# Patient Record
Sex: Male | Born: 1935 | Race: White | Hispanic: No | Marital: Married | Smoking: Former smoker
Health system: Southern US, Community
[De-identification: ages and names within clinical notes are randomized; demographics above are authoritative.]

## PROBLEM LIST (undated history)

## (undated) DIAGNOSIS — I359 Nonrheumatic aortic valve disorder, unspecified: Secondary | ICD-10-CM

## (undated) DIAGNOSIS — E119 Type 2 diabetes mellitus without complications: Secondary | ICD-10-CM

## (undated) DIAGNOSIS — D696 Thrombocytopenia, unspecified: Secondary | ICD-10-CM

## (undated) DIAGNOSIS — N189 Chronic kidney disease, unspecified: Secondary | ICD-10-CM

## (undated) DIAGNOSIS — K219 Gastro-esophageal reflux disease without esophagitis: Secondary | ICD-10-CM

## (undated) DIAGNOSIS — I10 Essential (primary) hypertension: Secondary | ICD-10-CM

## (undated) DIAGNOSIS — N401 Enlarged prostate with lower urinary tract symptoms: Secondary | ICD-10-CM

## (undated) DIAGNOSIS — E7800 Pure hypercholesterolemia, unspecified: Secondary | ICD-10-CM

## (undated) DIAGNOSIS — E785 Hyperlipidemia, unspecified: Secondary | ICD-10-CM

## (undated) DIAGNOSIS — K7689 Other specified diseases of liver: Secondary | ICD-10-CM

## (undated) DIAGNOSIS — M109 Gout, unspecified: Secondary | ICD-10-CM

## (undated) DIAGNOSIS — K7581 Nonalcoholic steatohepatitis (NASH): Secondary | ICD-10-CM

## (undated) DIAGNOSIS — I2510 Atherosclerotic heart disease of native coronary artery without angina pectoris: Secondary | ICD-10-CM

## (undated) HISTORY — DX: Essential (primary) hypertension: I10

## (undated) HISTORY — DX: Nonalcoholic steatohepatitis (NASH): K75.81

## (undated) HISTORY — PX: AORTIC VALVE REPLACEMENT: SHX41

## (undated) HISTORY — DX: Gout, unspecified: M10.9

## (undated) HISTORY — DX: Gastro-esophageal reflux disease without esophagitis: K21.9

## (undated) HISTORY — DX: Other specified diseases of liver: K76.89

## (undated) HISTORY — DX: Nonrheumatic aortic valve disorder, unspecified: I35.9

## (undated) HISTORY — PX: CORONARY ARTERY BYPASS GRAFT: SHX141

## (undated) HISTORY — PX: CHOLECYSTECTOMY: SHX55

## (undated) HISTORY — DX: Benign prostatic hyperplasia with lower urinary tract symptoms: N40.1

## (undated) HISTORY — DX: Atherosclerotic heart disease of native coronary artery without angina pectoris: I25.10

## (undated) HISTORY — DX: Chronic kidney disease, unspecified: N18.9

## (undated) HISTORY — DX: Thrombocytopenia, unspecified: D69.6

## (undated) HISTORY — DX: Pure hypercholesterolemia, unspecified: E78.00

## (undated) HISTORY — DX: Type 2 diabetes mellitus without complications: E11.9

## (undated) HISTORY — DX: Hyperlipidemia, unspecified: E78.5

---

## 2000-07-03 ENCOUNTER — Other Ambulatory Visit: Admission: RE | Admit: 2000-07-03 | Discharge: 2000-07-03 | Payer: Self-pay | Admitting: Urology

## 2000-07-03 ENCOUNTER — Encounter (INDEPENDENT_AMBULATORY_CARE_PROVIDER_SITE_OTHER): Payer: Self-pay | Admitting: Specialist

## 2000-08-03 ENCOUNTER — Ambulatory Visit (HOSPITAL_COMMUNITY): Admission: RE | Admit: 2000-08-03 | Discharge: 2000-08-03 | Payer: Self-pay | Admitting: Gastroenterology

## 2000-08-03 ENCOUNTER — Encounter (INDEPENDENT_AMBULATORY_CARE_PROVIDER_SITE_OTHER): Payer: Self-pay | Admitting: *Deleted

## 2000-08-15 ENCOUNTER — Encounter: Payer: Self-pay | Admitting: Gastroenterology

## 2000-08-15 ENCOUNTER — Encounter: Admission: RE | Admit: 2000-08-15 | Discharge: 2000-08-15 | Payer: Self-pay | Admitting: Gastroenterology

## 2001-07-11 ENCOUNTER — Ambulatory Visit (HOSPITAL_COMMUNITY): Admission: RE | Admit: 2001-07-11 | Discharge: 2001-07-11 | Payer: Self-pay | Admitting: Gastroenterology

## 2001-07-11 ENCOUNTER — Encounter (INDEPENDENT_AMBULATORY_CARE_PROVIDER_SITE_OTHER): Payer: Self-pay | Admitting: *Deleted

## 2003-10-31 ENCOUNTER — Encounter (INDEPENDENT_AMBULATORY_CARE_PROVIDER_SITE_OTHER): Payer: Self-pay | Admitting: Specialist

## 2003-10-31 ENCOUNTER — Ambulatory Visit (HOSPITAL_COMMUNITY): Admission: RE | Admit: 2003-10-31 | Discharge: 2003-10-31 | Payer: Self-pay | Admitting: Gastroenterology

## 2006-05-19 ENCOUNTER — Ambulatory Visit: Payer: Self-pay | Admitting: Endocrinology

## 2006-06-08 ENCOUNTER — Ambulatory Visit: Payer: Self-pay | Admitting: Endocrinology

## 2006-07-11 ENCOUNTER — Ambulatory Visit: Payer: Self-pay | Admitting: Endocrinology

## 2006-07-11 LAB — CONVERTED CEMR LAB
AST: 47 units/L — ABNORMAL HIGH (ref 0–37)
Albumin: 3.9 g/dL (ref 3.5–5.2)
Alkaline Phosphatase: 109 units/L (ref 39–117)
Total Bilirubin: 0.9 mg/dL (ref 0.3–1.2)
Triglycerides: 72 mg/dL (ref 0–149)

## 2006-07-24 ENCOUNTER — Ambulatory Visit: Payer: Self-pay | Admitting: Internal Medicine

## 2006-08-11 ENCOUNTER — Ambulatory Visit: Payer: Self-pay

## 2006-08-11 HISTORY — PX: TRANSTHORACIC ECHOCARDIOGRAM: SHX275

## 2006-08-11 HISTORY — PX: OTHER SURGICAL HISTORY: SHX169

## 2006-08-11 LAB — CONVERTED CEMR LAB
BUN: 21 mg/dL (ref 6–23)
CO2: 29 meq/L (ref 19–32)
GFR calc Af Amer: 59 mL/min
Glucose, Bld: 137 mg/dL — ABNORMAL HIGH (ref 70–99)
Potassium: 3.8 meq/L (ref 3.5–5.1)

## 2006-10-10 ENCOUNTER — Ambulatory Visit: Payer: Self-pay | Admitting: Endocrinology

## 2006-10-10 LAB — CONVERTED CEMR LAB
ALT: 30 units/L (ref 0–53)
AST: 40 units/L — ABNORMAL HIGH (ref 0–37)
Albumin: 4 g/dL (ref 3.5–5.2)
VLDL: 30 mg/dL (ref 0–40)

## 2006-11-02 ENCOUNTER — Encounter: Payer: Self-pay | Admitting: Endocrinology

## 2006-11-02 DIAGNOSIS — K219 Gastro-esophageal reflux disease without esophagitis: Secondary | ICD-10-CM

## 2006-11-02 DIAGNOSIS — E119 Type 2 diabetes mellitus without complications: Secondary | ICD-10-CM

## 2006-11-02 DIAGNOSIS — I2510 Atherosclerotic heart disease of native coronary artery without angina pectoris: Secondary | ICD-10-CM | POA: Insufficient documentation

## 2006-11-02 HISTORY — DX: Gastro-esophageal reflux disease without esophagitis: K21.9

## 2006-11-02 HISTORY — DX: Atherosclerotic heart disease of native coronary artery without angina pectoris: I25.10

## 2006-11-02 HISTORY — DX: Type 2 diabetes mellitus without complications: E11.9

## 2006-11-06 LAB — HM COLONOSCOPY: HM Colonoscopy: NORMAL

## 2007-02-07 ENCOUNTER — Ambulatory Visit: Payer: Self-pay | Admitting: Endocrinology

## 2007-02-09 ENCOUNTER — Ambulatory Visit: Payer: Self-pay | Admitting: Internal Medicine

## 2007-03-05 ENCOUNTER — Ambulatory Visit: Payer: Self-pay | Admitting: Internal Medicine

## 2007-06-07 ENCOUNTER — Ambulatory Visit: Payer: Self-pay | Admitting: Endocrinology

## 2007-06-14 ENCOUNTER — Telehealth (INDEPENDENT_AMBULATORY_CARE_PROVIDER_SITE_OTHER): Payer: Self-pay | Admitting: *Deleted

## 2007-06-14 ENCOUNTER — Ambulatory Visit: Payer: Self-pay | Admitting: Endocrinology

## 2007-06-14 DIAGNOSIS — M109 Gout, unspecified: Secondary | ICD-10-CM | POA: Insufficient documentation

## 2007-06-14 HISTORY — DX: Gout, unspecified: M10.9

## 2007-06-15 LAB — CONVERTED CEMR LAB: Uric Acid, Serum: 5.8 mg/dL (ref 2.4–7.0)

## 2007-08-17 ENCOUNTER — Ambulatory Visit: Payer: Self-pay | Admitting: Internal Medicine

## 2007-08-17 LAB — CONVERTED CEMR LAB
CO2: 30 meq/L (ref 19–32)
Chloride: 106 meq/L (ref 96–112)
Pro B Natriuretic peptide (BNP): 77 pg/mL (ref 0.0–100.0)
Sodium: 141 meq/L (ref 135–145)
Uric Acid, Serum: 5.8 mg/dL (ref 4.0–7.8)

## 2007-09-13 ENCOUNTER — Ambulatory Visit: Payer: Self-pay | Admitting: Internal Medicine

## 2007-09-13 LAB — CONVERTED CEMR LAB
Albumin: 4 g/dL (ref 3.5–5.2)
Alkaline Phosphatase: 136 units/L — ABNORMAL HIGH (ref 39–117)
Bilirubin, Direct: 0.2 mg/dL (ref 0.0–0.3)

## 2007-09-25 ENCOUNTER — Encounter: Payer: Self-pay | Admitting: Endocrinology

## 2007-10-08 ENCOUNTER — Ambulatory Visit: Payer: Self-pay | Admitting: Endocrinology

## 2007-10-08 DIAGNOSIS — E7800 Pure hypercholesterolemia, unspecified: Secondary | ICD-10-CM

## 2007-10-08 DIAGNOSIS — K7689 Other specified diseases of liver: Secondary | ICD-10-CM

## 2007-10-08 DIAGNOSIS — I10 Essential (primary) hypertension: Secondary | ICD-10-CM

## 2007-10-08 DIAGNOSIS — R609 Edema, unspecified: Secondary | ICD-10-CM

## 2007-10-08 HISTORY — DX: Other specified diseases of liver: K76.89

## 2007-10-08 HISTORY — DX: Pure hypercholesterolemia, unspecified: E78.00

## 2007-10-08 HISTORY — DX: Essential (primary) hypertension: I10

## 2007-11-12 ENCOUNTER — Ambulatory Visit: Payer: Self-pay | Admitting: Endocrinology

## 2007-11-14 LAB — CONVERTED CEMR LAB
AST: 47 units/L — ABNORMAL HIGH (ref 0–37)
Alkaline Phosphatase: 156 units/L — ABNORMAL HIGH (ref 39–117)
BUN: 15 mg/dL (ref 6–23)
Bilirubin, Direct: 0.3 mg/dL (ref 0.0–0.3)
Chloride: 109 meq/L (ref 96–112)
Eosinophils Absolute: 0.1 10*3/uL (ref 0.0–0.7)
Eosinophils Relative: 2.5 % (ref 0.0–5.0)
GFR calc non Af Amer: 49 mL/min
HDL: 29.4 mg/dL — ABNORMAL LOW (ref >39.0)
Hgb A1c MFr Bld: 5.6 % (ref 4.6–6.0)
MCV: 94 fL (ref 78.0–100.0)
Microalb Creat Ratio: 7.2 mg/g (ref 0.0–30.0)
Neutrophils Relative %: 67.5 % (ref 43.0–77.0)
PSA: 2.28 ng/mL (ref 0.10–4.00)
Platelets: 112 10*3/uL — ABNORMAL LOW (ref 150–400)
Potassium: 3.7 meq/L (ref 3.5–5.1)
RDW: 14.4 % (ref 11.5–14.6)
Sodium: 142 meq/L (ref 135–145)
TSH: 4.06 microintl units/mL (ref 0.35–5.50)
Total Bilirubin: 1 mg/dL (ref 0.3–1.2)
Total CHOL/HDL Ratio: 4.7
Uric Acid, Serum: 5.4 mg/dL (ref 4.0–7.8)
VLDL: 19 mg/dL (ref 0–40)
WBC: 4.6 10*3/uL (ref 4.5–10.5)

## 2007-11-19 ENCOUNTER — Telehealth: Payer: Self-pay | Admitting: Endocrinology

## 2007-11-20 ENCOUNTER — Ambulatory Visit: Payer: Self-pay | Admitting: Endocrinology

## 2007-11-20 DIAGNOSIS — R05 Cough: Secondary | ICD-10-CM

## 2007-11-28 ENCOUNTER — Telehealth (INDEPENDENT_AMBULATORY_CARE_PROVIDER_SITE_OTHER): Payer: Self-pay | Admitting: *Deleted

## 2007-12-24 ENCOUNTER — Ambulatory Visit: Payer: Self-pay | Admitting: Endocrinology

## 2008-02-22 ENCOUNTER — Telehealth: Payer: Self-pay | Admitting: Endocrinology

## 2008-02-22 ENCOUNTER — Ambulatory Visit: Payer: Self-pay | Admitting: Internal Medicine

## 2008-03-04 ENCOUNTER — Telehealth: Payer: Self-pay | Admitting: Endocrinology

## 2008-04-16 ENCOUNTER — Ambulatory Visit: Payer: Self-pay | Admitting: Endocrinology

## 2008-04-16 LAB — CONVERTED CEMR LAB: Hgb A1c MFr Bld: 7 % — ABNORMAL HIGH (ref 4.6–6.0)

## 2008-07-15 ENCOUNTER — Ambulatory Visit: Payer: Self-pay | Admitting: Endocrinology

## 2008-07-15 LAB — CONVERTED CEMR LAB: Hgb A1c MFr Bld: 6.5 % (ref 4.6–6.5)

## 2008-09-02 ENCOUNTER — Telehealth (INDEPENDENT_AMBULATORY_CARE_PROVIDER_SITE_OTHER): Payer: Self-pay | Admitting: *Deleted

## 2008-09-03 ENCOUNTER — Ambulatory Visit: Payer: Self-pay

## 2008-09-03 ENCOUNTER — Encounter: Payer: Self-pay | Admitting: Internal Medicine

## 2008-10-09 ENCOUNTER — Ambulatory Visit: Payer: Self-pay | Admitting: Internal Medicine

## 2008-10-15 ENCOUNTER — Encounter: Payer: Self-pay | Admitting: Endocrinology

## 2008-11-25 ENCOUNTER — Ambulatory Visit: Payer: Self-pay | Admitting: Endocrinology

## 2008-11-27 LAB — CONVERTED CEMR LAB
AST: 74 units/L — ABNORMAL HIGH (ref 0–37)
Albumin: 3.8 g/dL (ref 3.5–5.2)
BUN: 14 mg/dL (ref 6–23)
Basophils Absolute: 0 10*3/uL (ref 0.0–0.1)
CO2: 28 meq/L (ref 19–32)
Calcium: 9.4 mg/dL (ref 8.4–10.5)
Cholesterol: 121 mg/dL (ref 0–200)
Creatinine,U: 312.9 mg/dL
Eosinophils Absolute: 0.1 10*3/uL (ref 0.0–0.7)
GFR calc non Af Amer: 48.55 mL/min (ref >60)
Glucose, Bld: 164 mg/dL — ABNORMAL HIGH (ref 70–99)
HCT: 42.2 % (ref 39.0–52.0)
HDL: 30.4 mg/dL — ABNORMAL LOW (ref >39.00)
Hemoglobin, Urine: NEGATIVE
Hemoglobin: 14.3 g/dL (ref 13.0–17.0)
Hgb A1c MFr Bld: 6.7 % — ABNORMAL HIGH (ref 4.6–6.5)
Lymphs Abs: 1.4 10*3/uL (ref 0.7–4.0)
MCHC: 34 g/dL (ref 30.0–36.0)
Microalb, Ur: 14.1 mg/dL — ABNORMAL HIGH (ref 0.0–1.9)
Monocytes Relative: 9 % (ref 3.0–12.0)
Neutro Abs: 3.8 10*3/uL (ref 1.4–7.7)
PSA: 3.09 ng/mL (ref 0.10–4.00)
Platelets: 99 10*3/uL — ABNORMAL LOW (ref 150.0–400.0)
RDW: 15 % — ABNORMAL HIGH (ref 11.5–14.6)
TSH: 2.12 microintl units/mL (ref 0.35–5.50)
Total Bilirubin: 1.5 mg/dL — ABNORMAL HIGH (ref 0.3–1.2)
Total Protein, Urine: 30 mg/dL
Uric Acid, Serum: 4.8 mg/dL (ref 4.0–7.8)
Urine Glucose: NEGATIVE mg/dL
Urobilinogen, UA: 1 (ref 0.0–1.0)
VLDL: 13.6 mg/dL (ref 0.0–40.0)

## 2008-12-17 ENCOUNTER — Ambulatory Visit: Payer: Self-pay | Admitting: Endocrinology

## 2008-12-17 DIAGNOSIS — D696 Thrombocytopenia, unspecified: Secondary | ICD-10-CM

## 2008-12-17 DIAGNOSIS — R809 Proteinuria, unspecified: Secondary | ICD-10-CM | POA: Insufficient documentation

## 2008-12-17 HISTORY — DX: Thrombocytopenia, unspecified: D69.6

## 2008-12-18 LAB — CONVERTED CEMR LAB
HCV Ab: NEGATIVE
Hepatitis B Surface Ag: NEGATIVE

## 2008-12-23 ENCOUNTER — Encounter: Admission: RE | Admit: 2008-12-23 | Discharge: 2008-12-23 | Payer: Self-pay | Admitting: Endocrinology

## 2008-12-23 ENCOUNTER — Ambulatory Visit: Payer: Self-pay | Admitting: Internal Medicine

## 2008-12-23 ENCOUNTER — Ambulatory Visit: Payer: Self-pay | Admitting: Endocrinology

## 2008-12-23 ENCOUNTER — Telehealth: Payer: Self-pay | Admitting: Endocrinology

## 2008-12-24 ENCOUNTER — Encounter: Payer: Self-pay | Admitting: Endocrinology

## 2008-12-24 LAB — CBC WITH DIFFERENTIAL/PLATELET
EOS%: 2.5 % (ref 0.0–7.0)
LYMPH%: 25 % (ref 14.0–49.0)
MCH: 32.3 pg (ref 27.2–33.4)
MCV: 92.5 fL (ref 79.3–98.0)
MONO%: 9.5 % (ref 0.0–14.0)
Platelets: 90 10*3/uL — ABNORMAL LOW (ref 140–400)
RBC: 4.2 10*6/uL (ref 4.20–5.82)
RDW: 15.9 % — ABNORMAL HIGH (ref 11.0–14.6)

## 2008-12-24 LAB — COMPREHENSIVE METABOLIC PANEL
AST: 51 U/L — ABNORMAL HIGH (ref 0–37)
Albumin: 3.8 g/dL (ref 3.5–5.2)
Alkaline Phosphatase: 185 U/L — ABNORMAL HIGH (ref 39–117)
BUN: 16 mg/dL (ref 6–23)
Potassium: 3.8 mEq/L (ref 3.5–5.3)
Sodium: 138 mEq/L (ref 135–145)
Total Bilirubin: 1.4 mg/dL — ABNORMAL HIGH (ref 0.3–1.2)
Total Protein: 6.9 g/dL (ref 6.0–8.3)

## 2009-01-05 ENCOUNTER — Telehealth (INDEPENDENT_AMBULATORY_CARE_PROVIDER_SITE_OTHER): Payer: Self-pay | Admitting: *Deleted

## 2009-01-07 ENCOUNTER — Telehealth: Payer: Self-pay | Admitting: Endocrinology

## 2009-01-08 ENCOUNTER — Ambulatory Visit: Payer: Self-pay | Admitting: Endocrinology

## 2009-01-08 DIAGNOSIS — M542 Cervicalgia: Secondary | ICD-10-CM

## 2009-01-13 ENCOUNTER — Encounter: Payer: Self-pay | Admitting: Endocrinology

## 2009-01-16 ENCOUNTER — Ambulatory Visit (HOSPITAL_COMMUNITY): Admission: RE | Admit: 2009-01-16 | Discharge: 2009-01-16 | Payer: Self-pay | Admitting: General Surgery

## 2009-01-20 ENCOUNTER — Encounter: Payer: Self-pay | Admitting: Endocrinology

## 2009-01-22 ENCOUNTER — Ambulatory Visit (HOSPITAL_COMMUNITY): Admission: RE | Admit: 2009-01-22 | Discharge: 2009-01-22 | Payer: Self-pay | Admitting: General Surgery

## 2009-02-06 ENCOUNTER — Ambulatory Visit: Payer: Self-pay | Admitting: Endocrinology

## 2009-02-09 ENCOUNTER — Encounter: Payer: Self-pay | Admitting: Endocrinology

## 2009-02-09 ENCOUNTER — Encounter: Payer: Self-pay | Admitting: Internal Medicine

## 2009-02-11 ENCOUNTER — Encounter: Payer: Self-pay | Admitting: Endocrinology

## 2009-02-18 ENCOUNTER — Encounter: Payer: Self-pay | Admitting: Endocrinology

## 2009-02-21 ENCOUNTER — Encounter: Payer: Self-pay | Admitting: Endocrinology

## 2009-02-21 DIAGNOSIS — N138 Other obstructive and reflux uropathy: Secondary | ICD-10-CM

## 2009-02-21 DIAGNOSIS — N401 Enlarged prostate with lower urinary tract symptoms: Secondary | ICD-10-CM

## 2009-02-21 HISTORY — DX: Benign prostatic hyperplasia with lower urinary tract symptoms: N13.8

## 2009-02-21 HISTORY — DX: Benign prostatic hyperplasia with lower urinary tract symptoms: N40.1

## 2009-02-23 ENCOUNTER — Ambulatory Visit: Payer: Self-pay | Admitting: Endocrinology

## 2009-02-23 LAB — CONVERTED CEMR LAB: Hgb A1c MFr Bld: 6.8 % — ABNORMAL HIGH (ref 4.6–6.5)

## 2009-03-19 ENCOUNTER — Encounter: Payer: Self-pay | Admitting: Endocrinology

## 2009-03-25 ENCOUNTER — Encounter: Payer: Self-pay | Admitting: Internal Medicine

## 2009-03-25 ENCOUNTER — Encounter: Payer: Self-pay | Admitting: Endocrinology

## 2009-04-15 ENCOUNTER — Encounter: Payer: Self-pay | Admitting: Endocrinology

## 2009-05-05 ENCOUNTER — Encounter: Payer: Self-pay | Admitting: Internal Medicine

## 2009-05-05 ENCOUNTER — Encounter: Payer: Self-pay | Admitting: Endocrinology

## 2009-05-06 ENCOUNTER — Encounter: Payer: Self-pay | Admitting: Endocrinology

## 2009-05-13 ENCOUNTER — Telehealth (INDEPENDENT_AMBULATORY_CARE_PROVIDER_SITE_OTHER): Payer: Self-pay | Admitting: *Deleted

## 2009-05-15 ENCOUNTER — Encounter: Payer: Self-pay | Admitting: Endocrinology

## 2009-05-19 ENCOUNTER — Encounter: Payer: Self-pay | Admitting: Endocrinology

## 2009-05-22 ENCOUNTER — Encounter: Payer: Self-pay | Admitting: Endocrinology

## 2009-06-01 ENCOUNTER — Ambulatory Visit: Payer: Self-pay | Admitting: Endocrinology

## 2009-06-01 DIAGNOSIS — M25569 Pain in unspecified knee: Secondary | ICD-10-CM | POA: Insufficient documentation

## 2009-06-01 LAB — CONVERTED CEMR LAB
AST: 40 units/L — ABNORMAL HIGH (ref 0–37)
Albumin: 3.7 g/dL (ref 3.5–5.2)
Basophils Absolute: 0 10*3/uL (ref 0.0–0.1)
Eosinophils Relative: 2.7 % (ref 0.0–5.0)
HCT: 39.6 % (ref 39.0–52.0)
Hemoglobin: 13.1 g/dL (ref 13.0–17.0)
Hgb A1c MFr Bld: 7.8 % — ABNORMAL HIGH (ref 4.6–6.5)
Lymphocytes Relative: 23.2 % (ref 12.0–46.0)
Monocytes Relative: 5.9 % (ref 3.0–12.0)
Neutro Abs: 2.8 10*3/uL (ref 1.4–7.7)
RDW: 14.3 % (ref 11.5–14.6)
Sed Rate: 45 mm/hr — ABNORMAL HIGH (ref 0–22)
Total Bilirubin: 1.2 mg/dL (ref 0.3–1.2)
WBC: 4.1 10*3/uL — ABNORMAL LOW (ref 4.5–10.5)

## 2009-06-02 ENCOUNTER — Telehealth: Payer: Self-pay | Admitting: Endocrinology

## 2009-06-19 ENCOUNTER — Telehealth: Payer: Self-pay | Admitting: Endocrinology

## 2009-07-02 ENCOUNTER — Ambulatory Visit: Payer: Self-pay | Admitting: Internal Medicine

## 2009-07-02 DIAGNOSIS — E785 Hyperlipidemia, unspecified: Secondary | ICD-10-CM | POA: Insufficient documentation

## 2009-07-02 DIAGNOSIS — I359 Nonrheumatic aortic valve disorder, unspecified: Secondary | ICD-10-CM

## 2009-07-02 HISTORY — DX: Nonrheumatic aortic valve disorder, unspecified: I35.9

## 2009-07-02 HISTORY — DX: Hyperlipidemia, unspecified: E78.5

## 2009-07-06 ENCOUNTER — Telehealth (INDEPENDENT_AMBULATORY_CARE_PROVIDER_SITE_OTHER): Payer: Self-pay | Admitting: *Deleted

## 2009-07-24 ENCOUNTER — Ambulatory Visit: Payer: Self-pay | Admitting: Cardiology

## 2009-07-24 ENCOUNTER — Ambulatory Visit: Payer: Self-pay

## 2009-07-24 ENCOUNTER — Encounter: Payer: Self-pay | Admitting: Internal Medicine

## 2009-07-24 ENCOUNTER — Ambulatory Visit (HOSPITAL_COMMUNITY): Admission: RE | Admit: 2009-07-24 | Discharge: 2009-07-24 | Payer: Self-pay | Admitting: Internal Medicine

## 2009-08-03 ENCOUNTER — Telehealth: Payer: Self-pay | Admitting: Internal Medicine

## 2009-08-03 LAB — CONVERTED CEMR LAB
BUN: 12 mg/dL (ref 6–23)
Basophils Relative: 0.4 % (ref 0.0–3.0)
Calcium: 9.5 mg/dL (ref 8.4–10.5)
Cholesterol: 120 mg/dL (ref 0–200)
Creatinine, Ser: 1.1 mg/dL (ref 0.4–1.5)
Eosinophils Absolute: 0.1 10*3/uL (ref 0.0–0.7)
GFR calc non Af Amer: 69.33 mL/min (ref >60)
HDL: 44.8 mg/dL (ref >39.00)
Hemoglobin: 13.6 g/dL (ref 13.0–17.0)
LDL Cholesterol: 60 mg/dL (ref 0–99)
Lymphs Abs: 1.2 10*3/uL (ref 0.7–4.0)
MCHC: 34.8 g/dL (ref 30.0–36.0)
MCV: 91.5 fL (ref 78.0–100.0)
Monocytes Absolute: 0.4 10*3/uL (ref 0.1–1.0)
Neutro Abs: 3.9 10*3/uL (ref 1.4–7.7)
RBC: 4.26 M/uL (ref 4.22–5.81)
RDW: 16.1 % — ABNORMAL HIGH (ref 11.5–14.6)
Triglycerides: 78 mg/dL (ref 0.0–149.0)

## 2009-08-05 ENCOUNTER — Encounter: Payer: Self-pay | Admitting: Internal Medicine

## 2009-09-09 ENCOUNTER — Encounter: Payer: Self-pay | Admitting: Endocrinology

## 2009-09-09 ENCOUNTER — Telehealth: Payer: Self-pay | Admitting: Internal Medicine

## 2009-09-10 ENCOUNTER — Ambulatory Visit: Payer: Self-pay | Admitting: Endocrinology

## 2009-09-10 LAB — CONVERTED CEMR LAB: Hgb A1c MFr Bld: 7.3 % — ABNORMAL HIGH (ref 4.6–6.5)

## 2009-09-11 ENCOUNTER — Telehealth: Payer: Self-pay | Admitting: Internal Medicine

## 2009-09-14 ENCOUNTER — Telehealth (INDEPENDENT_AMBULATORY_CARE_PROVIDER_SITE_OTHER): Payer: Self-pay | Admitting: *Deleted

## 2009-10-14 ENCOUNTER — Encounter: Payer: Self-pay | Admitting: Endocrinology

## 2009-11-17 ENCOUNTER — Ambulatory Visit: Payer: Self-pay | Admitting: Internal Medicine

## 2009-11-17 DIAGNOSIS — I2510 Atherosclerotic heart disease of native coronary artery without angina pectoris: Secondary | ICD-10-CM

## 2009-11-17 HISTORY — DX: Atherosclerotic heart disease of native coronary artery without angina pectoris: I25.10

## 2009-11-20 ENCOUNTER — Telehealth: Payer: Self-pay | Admitting: Internal Medicine

## 2009-11-26 LAB — CONVERTED CEMR LAB
Basophils Absolute: 0 10*3/uL (ref 0.0–0.1)
Eosinophils Absolute: 0.1 10*3/uL (ref 0.0–0.7)
Hemoglobin: 12 g/dL — ABNORMAL LOW (ref 13.0–17.0)
Lymphocytes Relative: 26 % (ref 12.0–46.0)
MCHC: 34.5 g/dL (ref 30.0–36.0)
Neutro Abs: 2 10*3/uL (ref 1.4–7.7)
Neutrophils Relative %: 63.9 % (ref 43.0–77.0)
Platelets: 74 10*3/uL — ABNORMAL LOW (ref 150.0–400.0)
RDW: 16.1 % — ABNORMAL HIGH (ref 11.5–14.6)

## 2009-12-11 ENCOUNTER — Ambulatory Visit: Payer: Self-pay | Admitting: Endocrinology

## 2009-12-14 ENCOUNTER — Telehealth: Payer: Self-pay | Admitting: Internal Medicine

## 2009-12-15 LAB — CONVERTED CEMR LAB
Albumin: 4.4 g/dL (ref 3.5–5.2)
Basophils Absolute: 0 10*3/uL (ref 0.0–0.1)
CO2: 25 meq/L (ref 19–32)
Calcium: 9.7 mg/dL (ref 8.4–10.5)
Creatinine,U: 123.5 mg/dL
Eosinophils Absolute: 0.1 10*3/uL (ref 0.0–0.7)
Glucose, Bld: 139 mg/dL — ABNORMAL HIGH (ref 70–99)
HCT: 38.3 % — ABNORMAL LOW (ref 39.0–52.0)
HDL: 42.7 mg/dL (ref >39.00)
Hemoglobin, Urine: NEGATIVE
Hemoglobin: 13.1 g/dL (ref 13.0–17.0)
Hgb A1c MFr Bld: 6.6 % — ABNORMAL HIGH (ref 4.6–6.5)
Ketones, ur: NEGATIVE mg/dL
Lymphocytes Relative: 24.7 % (ref 12.0–46.0)
Lymphs Abs: 0.9 10*3/uL (ref 0.7–4.0)
MCHC: 34.2 g/dL (ref 30.0–36.0)
Microalb, Ur: 1.9 mg/dL (ref 0.0–1.9)
Monocytes Absolute: 0.3 10*3/uL (ref 0.1–1.0)
Neutro Abs: 2.5 10*3/uL (ref 1.4–7.7)
PSA: 0.94 ng/mL (ref 0.10–4.00)
RDW: 16.1 % — ABNORMAL HIGH (ref 11.5–14.6)
Sodium: 140 meq/L (ref 135–145)
TSH: 2.71 microintl units/mL (ref 0.35–5.50)
Uric Acid, Serum: 2.1 mg/dL — ABNORMAL LOW (ref 4.0–7.8)
Urine Glucose: NEGATIVE mg/dL
Urobilinogen, UA: 1 (ref 0.0–1.0)

## 2009-12-16 ENCOUNTER — Ambulatory Visit: Payer: Self-pay | Admitting: Hematology and Oncology

## 2009-12-25 ENCOUNTER — Ambulatory Visit: Payer: Self-pay | Admitting: Hematology and Oncology

## 2009-12-28 ENCOUNTER — Telehealth: Payer: Self-pay | Admitting: Endocrinology

## 2009-12-28 ENCOUNTER — Encounter: Payer: Self-pay | Admitting: Endocrinology

## 2009-12-28 LAB — CBC WITH DIFFERENTIAL/PLATELET
EOS%: 3.1 % (ref 0.0–7.0)
Eosinophils Absolute: 0.1 10*3/uL (ref 0.0–0.5)
LYMPH%: 25.6 % (ref 14.0–49.0)
MCH: 33.6 pg — ABNORMAL HIGH (ref 27.2–33.4)
MCV: 97.1 fL (ref 79.3–98.0)
MONO%: 6 % (ref 0.0–14.0)
NEUT#: 2.1 10*3/uL (ref 1.5–6.5)
Platelets: 71 10*3/uL — ABNORMAL LOW (ref 140–400)
RBC: 3.64 10*6/uL — ABNORMAL LOW (ref 4.20–5.82)
RDW: 15.7 % — ABNORMAL HIGH (ref 11.0–14.6)

## 2009-12-28 LAB — COMPREHENSIVE METABOLIC PANEL
AST: 29 U/L (ref 0–37)
Alkaline Phosphatase: 138 U/L — ABNORMAL HIGH (ref 39–117)
BUN: 17 mg/dL (ref 6–23)
Glucose, Bld: 266 mg/dL — ABNORMAL HIGH (ref 70–99)
Sodium: 139 mEq/L (ref 135–145)
Total Bilirubin: 1.2 mg/dL (ref 0.3–1.2)
Total Protein: 6.9 g/dL (ref 6.0–8.3)

## 2010-01-05 ENCOUNTER — Telehealth: Payer: Self-pay | Admitting: Endocrinology

## 2010-01-06 ENCOUNTER — Telehealth: Payer: Self-pay | Admitting: Endocrinology

## 2010-01-19 ENCOUNTER — Encounter: Payer: Self-pay | Admitting: Endocrinology

## 2010-01-20 ENCOUNTER — Telehealth: Payer: Self-pay | Admitting: Endocrinology

## 2010-02-02 ENCOUNTER — Other Ambulatory Visit: Payer: Self-pay | Admitting: Internal Medicine

## 2010-02-02 ENCOUNTER — Encounter: Payer: Self-pay | Admitting: Endocrinology

## 2010-02-02 LAB — CBC WITH DIFFERENTIAL/PLATELET
Basophils Absolute: 0 10*3/uL (ref 0.0–0.1)
Eosinophils Absolute: 0.1 10*3/uL (ref 0.0–0.5)
HCT: 36.9 % — ABNORMAL LOW (ref 38.4–49.9)
HGB: 12.7 g/dL — ABNORMAL LOW (ref 13.0–17.1)
LYMPH%: 25.9 % (ref 14.0–49.0)
MCV: 94.7 fL (ref 79.3–98.0)
MONO%: 8.6 % (ref 0.0–14.0)
NEUT#: 2.1 10*3/uL (ref 1.5–6.5)
NEUT%: 61 % (ref 39.0–75.0)
Platelets: 85 10*3/uL — ABNORMAL LOW (ref 140–400)
RBC: 3.9 10*6/uL — ABNORMAL LOW (ref 4.20–5.82)

## 2010-02-05 ENCOUNTER — Ambulatory Visit: Payer: Self-pay | Admitting: Internal Medicine

## 2010-02-05 ENCOUNTER — Ambulatory Visit (HOSPITAL_COMMUNITY)
Admission: RE | Admit: 2010-02-05 | Discharge: 2010-02-05 | Payer: Self-pay | Source: Home / Self Care | Admitting: Internal Medicine

## 2010-02-17 ENCOUNTER — Other Ambulatory Visit: Payer: Self-pay | Admitting: Internal Medicine

## 2010-02-17 ENCOUNTER — Encounter: Payer: Self-pay | Admitting: Endocrinology

## 2010-02-17 LAB — CBC WITH DIFFERENTIAL/PLATELET
BASO%: 0.7 % (ref 0.0–2.0)
Basophils Absolute: 0 10*3/uL (ref 0.0–0.1)
EOS%: 4.2 % (ref 0.0–7.0)
HCT: 35.3 % — ABNORMAL LOW (ref 38.4–49.9)
HGB: 12.3 g/dL — ABNORMAL LOW (ref 13.0–17.1)
LYMPH%: 21.4 % (ref 14.0–49.0)
MCH: 32.9 pg (ref 27.2–33.4)
MCHC: 34.9 g/dL (ref 32.0–36.0)
MCV: 94.4 fL (ref 79.3–98.0)
NEUT%: 65.1 % (ref 39.0–75.0)
Platelets: 78 10*3/uL — ABNORMAL LOW (ref 140–400)

## 2010-04-05 ENCOUNTER — Ambulatory Visit
Admission: RE | Admit: 2010-04-05 | Discharge: 2010-04-05 | Payer: Self-pay | Source: Home / Self Care | Attending: Internal Medicine | Admitting: Internal Medicine

## 2010-04-05 ENCOUNTER — Encounter: Payer: Self-pay | Admitting: Internal Medicine

## 2010-04-06 ENCOUNTER — Telehealth: Payer: Self-pay | Admitting: Internal Medicine

## 2010-04-09 ENCOUNTER — Telehealth: Payer: Self-pay | Admitting: Endocrinology

## 2010-04-20 NOTE — Progress Notes (Signed)
Summary: Pain meds?  Phone Note Call from Patient Call back at Home Phone [phone]   Caller: Patient Summary of Call: pt called stating that Tramadol is not strong enough for his knee pain. please advise Initial call taken by: Margaret Pyle, CMA,  June 19, 2009 4:02 PM  Follow-up for Phone Call        i printed rx for tyl #3 i referred you to ortho Follow-up by: Minus Breeding MD,  June 19, 2009 4:08 PM  Additional Follow-up for Phone Call Additional follow up Details #1::        pt informed, Rx faxed to pharmacy Additional Follow-up by: Margaret Pyle, CMA,  June 19, 2009 4:28 PM    New/Updated Medications: ACETAMINOPHEN-CODEINE #3 300-30 MG TABS (ACETAMINOPHEN-CODEINE) 1-2 every 4 hrs as needed for pain Prescriptions: ACETAMINOPHEN-CODEINE #3 300-30 MG TABS (ACETAMINOPHEN-CODEINE) 1-2 every 4 hrs as needed for pain  #50 x 0   Entered and Authorized by:   Minus Breeding MD   Signed by:   Minus Breeding MD on 06/19/2009   Method used:   Printed then faxed to ...       [facility] (retail)       [address]       [place], [place]  [address]       Ph: [phone]       Fax: [phone]   RxID:   [id number]

## 2010-04-20 NOTE — Letter (Signed)
Summary: [facility]  [facility]   Imported By: Lester Scotland 01/18/2010 08:06:08  _____________________________________________________________________  External Attachment:    Type:   Image     Comment:   External Document

## 2010-04-20 NOTE — Assessment & Plan Note (Signed)
Summary: 9 MONTH ROV/SL  Medications Added NITROSTAT 0.4 MG SUBL (NITROGLYCERIN) 1 tablet under tongue at onset of chest pain; you may repeat every 5 minutes for up to 3 doses.      Allergies Added: NKDA  Visit Type:  Follow-up Primary Provider:  dr Everardo All  CC:  no complaints-Pt was on vesicare but he was having problems with urination so he had to go to ER([facility]) to get cath -pt was taking off of vesicare.  History of Present Illness: Patient is a 77 year old with a history of CAD (s/p CABG in 1996), dyslipidemia, hypertenison, diabetes, mild aortic stenosis and gout.  I last saw him in July.  Note myoview in June 2010 was a low risk scan with only a question of basal inferior ischemia.  Normal perfusion elsewhere. Since seen he denies chest pain.  Breathing is unchanged.  He does note that Dr. Ewing Schlein stopped his aspirin because of liver problems.  Current Medications (verified): 1)  Folic Acid 1 Mg  Tabs (Folic Acid) .... Take 1 By Mouth Qd 2)  Prilosec Otc 20 Mg Tbec (Omeprazole Magnesium) .... Qd 3)  Pravastatin Sodium 80 Mg Tabs (Pravastatin Sodium) .... Take 1 By Mouth At Bedtime 4)  Onetouch Ultra Test  Strp (Glucose Blood) .... Check Bs Qid 5)  Onetouch Lancets  Misc (Lancets) .... Use Qid 6)  Benicar 40 Mg Tabs (Olmesartan Medoxomil) .Marland Kitchen.. 1 Once Daily 7)  Amlodipine Besylate 2.5 Mg Tabs (Amlodipine Besylate) .Marland Kitchen.. 1 Once Daily 8)  Allopurinol 300 Mg Tabs (Allopurinol) .Marland Kitchen.. 1 Once Daily 9)  Metformin Hcl 500 Mg Xr24h-Tab (Metformin Hcl) .Marland Kitchen.. 1 Once Daily 10)  Acetaminophen-Codeine #3 300-30 Mg Tabs (Acetaminophen-Codeine) .Marland Kitchen.. 1-2 Every 4 Hrs As Needed For Pain  Allergies (verified): No Known Drug Allergies  Past History:  Past medical, surgical, family and social histories (including risk factors) reviewed, and no changes noted (except as noted below).  Past Medical History: Reviewed history from 10/08/2007 and no changes required. Coronary artery  disease Diabetes mellitus, type II GERD NASh PURE HYPERCHOLESTEROLEMIA (ICD-272.0) GOUT (ICD-274.9) GERD (ICD-530.81) DIABETES MELLITUS, TYPE II (ICD-250.00) CORONARY ARTERY DISEASE (ICD-414.00)  Past Surgical History: Reviewed history from 11/02/2006 and no changes required. Coronary artery bypass graft Stress Cardiolite (08/11/2006 Transthoracic Echocardiogram (08/11/2006) EKG (07/24/2006  Family History: Reviewed history from 11/20/2007 and no changes required. mother had pancreatic cancer sister had colon cancer at advanced age  Social History: Reviewed history from 10/09/2008 and no changes required. married retired No tobacco  Review of Systems       All systems reviewed.  Neg to the above problem.  Vital Signs:  Patient profile:   77 year old male Height:      69 inches Weight:      167 pounds BMI:     24.75 Pulse rate:   68 / minute BP sitting:   145 / 77  (left arm) Cuff size:   large  Vitals Entered By: Burnett Kanaris, CNA (July 02, 2009 10:39 AM)  Physical Exam  Additional Exam:  Patient is in NAD. HEENT:  Normocephalic, atraumatic. EOMI, PERRLA.  Neck: JVP is normal. No thyromegaly. No bruits.  Lungs: clear to auscultation. No rales no wheezes.  Heart: Regular rate and rhythm. Normal S1, S2. No S3.  Gr II/VI systolic murmur.Marland Kitchen PMI not displaced.  Abdomen:  Supple, nontender. Normal bowel sounds. No masses. No hepatomegaly.  Extremities:   Good distal pulses throughout. No lower extremity edema.  Musculoskeletal :moving all extremities.  Neuro:   alert and oriented x3.    EKG  Procedure date:  07/02/2009  Findings:      NSR.  68 bpm.  First degree AV block.  Impression & Recommendations:  Problem # 1:  CORONARY ARTERY DISEASE (ICD-414.00) No signs to suggest ischemia.  COntinue meds.  Will need periodic myoviews to evaluate graft patency.will need to get records from [facility] re liver and ASA use.  Problem # 2:  AORTIC STENOSIS  (ICD-424.1) Mild on last echo a couple years ago.  Will repeat to reassess.  Problem # 3:  HYPERTENSION (ICD-401.9) BP was 120/68 on recheck..  COntinue meds.  Problem # 4:  HYPERLIPIDEMIA-MIXED (ICD-272.4) Fasting lipids:  LDL was 77, HDL 30.  encouraged activity.  With DM will not add other agents. His updated medication list for this problem includes:    Pravastatin Sodium 80 Mg Tabs (Pravastatin sodium) .Marland Kitchen... Take 1 by mouth at bedtime  Other Orders: EKG w/ Interpretation (93000) Echocardiogram (Echo) TLB-Lipid Panel (80061-LIPID) TLB-AST (SGOT) (84450-SGOT) TLB-CBC Platelet - w/Differential (85025-CBCD) TLB-BMP (Basic Metabolic Panel-BMET) (80048-METABOL) Prescriptions: NITROSTAT 0.4 MG SUBL (NITROGLYCERIN) 1 tablet under tongue at onset of chest pain; you may repeat every 5 minutes for up to 3 doses.  #25 x 6   Entered by:   Layne Benton, RN, BSN   Authorized by:   Sherrill Raring, MD, [facility]   Signed by:   Layne Benton, RN, BSN on 07/02/2009   Method used:   Electronically to        [facility] (retail)       [address]       [place], [place]  [address]       Ph: [phone]       Fax: [phone]   RxID:   [id number]

## 2010-04-20 NOTE — Assessment & Plan Note (Signed)
Summary: 3 MO ROV /NWS  #   Vital Signs:  Patient profile:   77 year old male Height:      69 inches (175.26 cm) Weight:      171.50 pounds (77.95 kg) BMI:     25.42 O2 Sat:      97 % on Room air Temp:     97.7 degrees F (36.50 degrees C) oral Pulse rate:   71 / minute Pulse rhythm:   regular BP sitting:   112 / 72  (left arm) Cuff size:   regular  Vitals Entered By: Brenton Grills MA (December 11, 2009 1:35 PM)  O2 Flow:  Room air CC: 3 month F/U/aj Is Patient Diabetic? Yes   Primary Provider:  dr Everardo All  CC:  3 month F/U/aj.  History of Present Illness: here for regular wellness examination.  He's feeling pretty well in general, and does not drink or smoke.   Current Medications (verified): 1)  Folic Acid 1 Mg  Tabs (Folic Acid) .... Take 1 By Mouth Qd 2)  Prilosec Otc 20 Mg Tbec (Omeprazole Magnesium) .... Qd 3)  Pravastatin Sodium 80 Mg Tabs (Pravastatin Sodium) .... Take 1 By Mouth At Bedtime 4)  Onetouch Ultra Test  Strp (Glucose Blood) .... Check Bs Qid 5)  Onetouch Lancets  Misc (Lancets) .... Use Qid 6)  Benicar 40 Mg Tabs (Olmesartan Medoxomil) .Marland Kitchen.. 1 Once Daily 7)  Amlodipine Besylate 2.5 Mg Tabs (Amlodipine Besylate) .Marland Kitchen.. 1 Once Daily 8)  Allopurinol 300 Mg Tabs (Allopurinol) .Marland Kitchen.. 1 Once Daily 9)  Metformin Hcl 500 Mg Xr24h-Tab (Metformin Hcl) .... 4 Tabs Once Daily 10)  Nitrostat 0.4 Mg Subl (Nitroglycerin) .Marland Kitchen.. 1 Tablet Under Tongue At Onset of Chest Pain; You May Repeat Every 5 Minutes For Up To 3 Doses.  Allergies (verified): No Known Drug Allergies  Family History: Reviewed history from 11/20/2007 and no changes required. mother had pancreatic cancer sister had colon cancer at advanced age  Social History: Reviewed history from 10/09/2008 and no changes required. married retired No tobacco  Review of Systems       The patient complains of weight gain.  The patient denies chest pain, dyspnea on exertion, fever, vision loss, decreased  hearing, syncope, prolonged cough, headaches, abdominal pain, melena, severe indigestion/heartburn, suspicious skin lesions, and depression.    Physical Exam  General:  normal appearance.   Head:  head: no deformity eyes: no periorbital swelling, no proptosis external nose and ears are normal mouth: no lesion seen Neck:  Supple without thyroid enlargement or tenderness.  Chest Wall:  there is a healed sternotomy scar Heart:  Regular rate and rhythm without gallops noted. Normal S1,S2.  there is a soft syst murmur Abdomen:  abdomen is soft, nontender.  no hepatosplenomegaly.   not distended.   there is a self-reducing ventral hernia Rectal:  normal external and internal exam.  heme neg  Prostate:  Normal size prostate without masses or tenderness.  Msk:  muscle bulk and strength are grossly normal.  no obvious joint swelling.  gait is normal and steady there is a healed scar on the right shoulder Extremities:  no deformity.  no ulcer on the feet.  feet are of normal color and temp.   there is hyperpigmentation of the ankles trace right pedal edema and trace left pedal edema.  there is a healed scar at the left leg (vein donor site)  trace right pedal edema, trace left pedal edema, and mycotic toenails.   Neurologic:  cn 2-12 grossly intact.   readily moves all 4's.   sensation is intact to touch on the feet  Skin:  multiple actinic keratoses Cervical Nodes:  No significant adenopathy.  Additional Exam:  SEPARATE EVALUATION FOLLOWS--EACH PROBLEM HERE IS NEW, NOT RESPONDING TO TREATMENT, OR POSES SIGNIFICANT RISK TO THE PATIENT'S HEALTH: HISTORY OF THE PRESENT ILLNESS: he takes and tolerates benicar well, but wants a cheaper med pt states ongoing  "constant worry" thrombocytopenia:  is worse on recent labs, but no easy bruising PAST MEDICAL HISTORY reviewed and up to date today REVIEW OF SYSTEMS: denies brbpr and hematuria PHYSICAL EXAMINATION: chest:  clear to auscultation.  no  respiratory distress dorsalis pedis intact bilat.  no carotid bruit psych:  does not appear anxious nor depressed LAB/XRAY RESULTS: Platelet Count       [L]  84.0 K/uL      IMPRESSION: htn, well-controlled, but wants cheaper med anxiety, needs increased rx thrombocytopenia, worse PLAN: see instruction sheet   Impression & Recommendations:  Problem # 1:  ROUTINE GENERAL MEDICAL EXAM@HEALTH  CARE FACL (ICD-V70.0)  Medications Added to Medication List This Visit: 1)  Buspirone Hcl 10 Mg Tabs (Buspirone hcl) .Marland Kitchen.. 1 tab two times a day  Other Orders: Flu Vaccine 7yrs + MEDICARE PATIENTS (Z6109) Administration Flu vaccine - MCR (U0454) Hematology Referral (Hematology) TLB-Lipid Panel (80061-LIPID) TLB-BMP (Basic Metabolic Panel-BMET) (80048-METABOL) TLB-CBC Platelet - w/Differential (85025-CBCD) TLB-Hepatic/Liver Function Pnl (80076-HEPATIC) TLB-TSH (Thyroid Stimulating Hormone) (84443-TSH) TLB-A1C / Hgb A1C (Glycohemoglobin) (83036-A1C) TLB-Microalbumin/Creat Ratio, Urine (82043-MALB) TLB-Udip w/ Micro (81001-URINE) TLB-PSA (Prostate Specific Antigen) (84153-PSA) TLB-Uric Acid, Blood (84550-URIC) Est. Patient Level V ([address]) Est. Patient 65& > (91478)  Patient Instructions: 1)  blood tests are being ordered for you today.  please call [phone] to hear your test results. 2)  call when you get low on benicar samples, and i'll change to generic losartan. 3)  Please schedule a follow-up appointment in 6 months. 4)  please consider these measures for your health:  minimize alcohol.  do not use tobacco products.  have a colonoscopy at least every 10 years from age 53.  keep firearms safely stored.  always use seat belts.  have working smoke alarms in your home.  see an eye doctor and dentist regularly.  never drive under the influence of alcohol or drugs (including prescription drugs).  those with fair skin should take precautions against the sun. 5)  please let me know what your wishes  would be, if artificial life support measures should become necessary.  it is critically important to prevent falling down (keep floor areas well-lit, dry, and free of loose objects) 6)  update:  pt requests dnr 7)  take buspirone 10 mg two times a day.  call 2 weeks if theis does not help, and i'll consider increasing it. 8)  (update: i left message on phone-tree:  refer hematology). Prescriptions: BUSPIRONE HCL 10 MG TABS (BUSPIRONE HCL) 1 tab two times a day  #60 x 11   Entered and Authorized by:   Minus Breeding MD   Signed by:   Minus Breeding MD on 12/11/2009   Method used:   Electronically to        [facility] (retail)       [address]       [place], [place]  [address]       Ph: [phone]       Fax: [phone]  RxID:   [id number]      Flu Vaccine Consent Questions     Do you have a history of severe allergic reactions to this vaccine? no    Any prior history of allergic reactions to egg and/or gelatin? no    Do you have a sensitivity to the preservative Thimersol? no    Do you have a past history of Guillan-Barre Syndrome? no    Do you currently have an acute febrile illness? no    Have you ever had a severe reaction to latex? no    Vaccine information given and explained to patient? yes    Are you currently pregnant? no    Lot Number:[id number]   Exp Date:09/18/2010   Site Given  Right Deltoid IMlu

## 2010-04-20 NOTE — Letter (Signed)
Summary: [facility]   Imported By: Sherian Rein 09/10/2009 15:12:29  _____________________________________________________________________  External Attachment:    Type:   Image     Comment:   External Document

## 2010-04-20 NOTE — Letter (Signed)
Summary: [facility]  [facility]   Imported By: Sherian Rein 02/22/2010 09:17:22  _____________________________________________________________________  External Attachment:    Type:   Image     Comment:   External Document

## 2010-04-20 NOTE — Consult Note (Signed)
Summary: [facility]   Imported By: Marylou Mccoy 07/02/2009 14:35:56  _____________________________________________________________________  External Attachment:    Type:   Image     Comment:   External Document

## 2010-04-20 NOTE — Progress Notes (Signed)
Summary: rx refill req  Phone Note Refill Request Message from:  Fax from Pharmacy on January 20, 2010 10:49 AM  Refills Requested: Medication #1:  PRILOSEC OTC 20 MG TBEC qd   Dosage confirmed as above?Dosage Confirmed   Last Refilled: 12/21/2009  Method Requested: Electronic Next Appointment Scheduled: 06/17/2010 Initial call taken by: Brenton Grills CMA (AAMA),  January 20, 2010 10:50 AM    Prescriptions: PRILOSEC OTC 20 MG TBEC (OMEPRAZOLE MAGNESIUM) qd  #30 x 5   Entered by:   Brenton Grills CMA (AAMA)   Authorized by:   Minus Breeding MD   Signed by:   Brenton Grills CMA (AAMA) on 01/20/2010   Method used:   Electronically to        [facility] (retail)       [address]       [place], [place]  [address]       Ph: [phone]       Fax: [phone]   RxID:   [id number]

## 2010-04-20 NOTE — Letter (Signed)
Summary: [facility]  [facility]   Imported By: Sherian Rein 05/20/2009 09:17:26  _____________________________________________________________________  External Attachment:    Type:   Image     Comment:   External Document

## 2010-04-20 NOTE — Progress Notes (Signed)
Summary: returning call   Phone Note Call from Patient Call back at Home Phone [phone]   Caller: Patient Summary of Call: returning call Initial call taken by: Migdalia Dk,  September 11, 2009 8:53 AM  Follow-up for Phone Call        LM for call back. Follow-up by: Suzan Garibaldi RN  Additional Follow-up for Phone Call Additional follow up Details #1::        Patient aware to take 1 ECASA 81mg  every day. Additional Follow-up by: Suzan Garibaldi RN

## 2010-04-20 NOTE — Letter (Signed)
[facility]: [facility]   Imported By: Sherian Rein 03/26/2009 07:40:00  _____________________________________________________________________  External Attachment:    Type:   Image     Comment:   External Document

## 2010-04-20 NOTE — Letter (Signed)
[facility]   Imported By: Marylou Mccoy 10/14/2009 17:30:37  _____________________________________________________________________  External Attachment:    Type:   Image     Comment:   External Document

## 2010-04-20 NOTE — Progress Notes (Signed)
  Phone Note Call from Patient Call back at Home Phone [phone]   Caller: Patient Call For: Dr Everardo All Summary of Call: Pt states he has called pharmacy several times to see if his prescriptions are ready, pharmacy states they have not received an electronic request yet, [facility] in [place] is the correct pharmacy. Pt was seen 06/01/08. Please advise. Initial call taken by: Verdell Face,  June 02, 2009 10:51 AM  Follow-up for Phone Call        per pharamcy e-scribe not working. Rx called in Follow-up by: Margaret Pyle, CMA,  June 02, 2009 11:02 AM    Prescriptions: TRAMADOL HCL 50 MG TABS (TRAMADOL HCL) 1 every 4 hrs as needed pain  #50 x 11   Entered and Authorized by:   Margaret Pyle, CMA   Signed by:   Margaret Pyle, CMA on 06/02/2009   Method used:   Telephoned to ...       [facility] (retail)       [address]       [place], [place]  [address]       Ph: [phone]       Fax: [phone]   RxID:   [id number] AMLODIPINE BESYLATE 2.5 MG TABS (AMLODIPINE BESYLATE) 1 once daily  #30 x 11   Entered and Authorized by:   Margaret Pyle, CMA   Signed by:   Margaret Pyle, CMA on 06/02/2009   Method used:   Telephoned to ...       [facility] (retail)       [address]       [place], [place]  [address]       Ph: [phone]       Fax: [phone]   RxID:   [id number] ALLOPURINOL 300 MG TABS (ALLOPURINOL) 1 once daily  #30 x 11   Entered and Authorized by:   Margaret Pyle, CMA   Signed by:   Margaret Pyle, CMA on 06/02/2009   Method used:   Telephoned to ...       [facility] (retail)       [address]       [place], [place]  [address]       Ph: [phone]       Fax: [phone]   RxID:   [id number] METFORMIN HCL 500 MG XR24H-TAB  (METFORMIN HCL) 1 once daily  #60 x 11   Entered and Authorized by:   Margaret Pyle, CMA   Signed by:   Margaret Pyle, CMA on 06/02/2009   Method used:   Telephoned to ...       [facility] (retail)       [address]       [place], [place]  [address]       Ph: [phone]       Fax: [phone]   RxID:   [id number]

## 2010-04-20 NOTE — Letter (Signed)
Summary: [facility]   Imported By: Sherian Rein 05/08/2009 09:53:29  _____________________________________________________________________  External Attachment:    Type:   Image     Comment:   External Document

## 2010-04-20 NOTE — Procedures (Signed)
[facility]   Imported By: Earl Many 08/26/2009 15:06:18  _____________________________________________________________________  External Attachment:    Type:   Image     Comment:   External Document

## 2010-04-20 NOTE — Progress Notes (Signed)
  Phone Note Other Incoming   [phone] Summary of Call: Dr Shirline Frees called and wants to speak with Dr Everardo All about discontinuing some of this pt's medications due to his low platelet count. I informed Dr Shirline Frees that Dr Everardo All will not be back in the office until Weds. He is ok with that.  Initial call taken by: Ami Bullins CMA,  December 28, 2009 3:49 PM  Follow-up for Phone Call        i called dr Asa Lente office 12/30/09, and asked him to call back. Follow-up by: Minus Breeding MD,  December 30, 2009 6:53 PM  Additional Follow-up for Phone Call Additional follow up Details #1::        i called 01/01/10.  left message to call back Additional Follow-up by: Minus Breeding MD,  January 01, 2010 12:56 PM    2

## 2010-04-20 NOTE — Progress Notes (Signed)
  Phone Note Call from Patient Call back at Home Phone [phone]   Caller: Patient Call For: Minus Breeding MD Summary of Call: Pt called and is requesting refill of tylenol#3 for pain. Pt states he cannot make an ov at this time due to living so far away in [place], pt requesting a refill at this time, please advise. Initial call taken by: Verdell Face,  July 06, 2009 12:14 PM  Follow-up for Phone Call        I called and informed pt that he would need to be seen by MD to get addt refills. Pt cancelled ortho referral. Follow-up by: Josph Macho RMA,  July 06, 2009 1:31 PM

## 2010-04-20 NOTE — Progress Notes (Signed)
Summary: test results   Phone Note Call from Patient Call back at Home Phone [phone]   Reason for Call: Lab or Test Results Initial call taken by: Judie Grieve,  Aug 03, 2009 1:17 PM  Follow-up for Phone Call        Called patient with results of echo and lab results. Follow-up by: Suzan Garibaldi RN

## 2010-04-20 NOTE — Consult Note (Signed)
Summary: [facility]   Imported By: Marylou Mccoy 07/02/2009 14:40:10  _____________________________________________________________________  External Attachment:    Type:   Image     Comment:   External Document

## 2010-04-20 NOTE — Letter (Signed)
Summary: [facility]  [facility]   Imported By: Lennie Odor 02/12/2010 14:44:41  _____________________________________________________________________  External Attachment:    Type:   Image     Comment:   External Document

## 2010-04-20 NOTE — Assessment & Plan Note (Signed)
Summary: 6 mos / $50 / cd   Vital Signs:  Patient profile:   77 year old male Height:      69 inches (175.26 cm) Weight:      168.25 pounds (76.48 kg) O2 Sat:      98 % on Room air Temp:     98.0 degrees F (36.67 degrees C) oral Pulse rate:   64 / minute BP sitting:   160 / 82  (left arm) Cuff size:   large  Vitals Entered By: Josph Macho RMA (June 01, 2009 12:57 PM)  O2 Flow:  Room air CC: 6 month follow up/ CF Is Patient Diabetic? Yes   Primary Provider:  dr Everardo All  CC:  6 month follow up/ CF.  History of Present Illness: pt states 2 weeks of slight swelling of the right knee, and associated pain. no cbg record, but states cbg's are approx 200's.  he says actos was too expensive. he takes bp meds as rx'ed.  Current Medications (verified): 1)  Folic Acid 1 Mg  Tabs (Folic Acid) .... Take 1 By Mouth Qd 2)  Adult Aspirin Low Strength 81 Mg  Tbdp (Aspirin) .... Take 1 By Mouth Qd 3)  Nitrostat 0.4 Mg  Subl (Nitroglycerin) .... Take Prn 4)  Allopurinol 100 Mg  Tabs (Allopurinol) .Marland Kitchen.. 1 1/2 Qd 5)  Prilosec Otc 20 Mg Tbec (Omeprazole Magnesium) .... Qd 6)  Pravastatin Sodium 80 Mg Tabs (Pravastatin Sodium) .... Take 1 By Mouth At Bedtime 7)  Onetouch Ultra Test  Strp (Glucose Blood) .... Check Bs Qid 8)  Onetouch Lancets  Misc (Lancets) .... Use Qid 9)  Colchicine 0.6 Mg Tabs (Colchicine) .... 6 Tabs Daily 10)  Diovan 320 Mg Tabs (Valsartan) .Marland Kitchen.. 1 Qd  Allergies (verified): No Known Drug Allergies  Review of Systems  The patient denies fever and dyspnea on exertion.    Physical Exam  General:  normal appearance.   Msk:  gait: favors right leg. Extremities:  right knee normal to my exam Additional Exam:  Total Bilirubin           1.2 mg/dL                   4.3-3.2   Direct Bilirubin     [H]  0.4 mg/dL                   9.5-1.8   Alkaline Phosphatase [H]  162 U/L                     39-117   AST                  [H]  40 U/L                      0-37   ALT                        26 U/L                      0-53   Total Protein             8.0 g/dL                    8.4-1.6   Albumin                   3.7 g/dL  3.5-5.2    Hemoglobin A1C       [H]  7.8 %     Impression & Recommendations:  Problem # 1:  KNEE PAIN, RIGHT (ICD-719.46) uncertain etiology  Problem # 2:  DIABETES MELLITUS, TYPE II (ICD-250.00) needs increased rx  Problem # 3:  HYPERTENSION (ICD-401.9) needs increased rx  Medications Added to Medication List This Visit: 1)  Colchicine 0.6 Mg Tabs (Colchicine) .... 6 tabs daily 2)  Tramadol Hcl 50 Mg Tabs (Tramadol hcl) .Marland Kitchen.. 1 every 4 hrs as needed pain 3)  Benicar 40 Mg Tabs (Olmesartan medoxomil) .Marland Kitchen.. 1 once daily 4)  Amlodipine Besylate 2.5 Mg Tabs (Amlodipine besylate) .Marland Kitchen.. 1 once daily 5)  Allopurinol 300 Mg Tabs (Allopurinol) .Marland Kitchen.. 1 once daily 6)  Metformin Hcl 500 Mg Xr24h-tab (Metformin hcl) .Marland Kitchen.. 1 once daily  Other Orders: TLB-Hepatic/Liver Function Pnl (80076-HEPATIC) TLB-A1C / Hgb A1C (Glycohemoglobin) (83036-A1C) TLB-CBC Platelet - w/Differential (85025-CBCD) TLB-Sedimentation Rate (ESR) (85652-ESR) TLB-Uric Acid, Blood (84550-URIC) T-Knee Right 2 view (73560TC) Est. Patient Level IV ([address])  Patient Instructions: 1)  x ray and blood tests today. 2)  tests are being ordered for you today.  a few days after the test(s), please call [phone] to hear your test results. 3)  tramadol 50 mg every 4 hrs as needed for pain. 4)  call if you want to see orthopedic dr. 5)  change diovan benicar 40 mg once daily 6)  add amlodipine 2.5 mg once daily. 7)  Please schedule a follow-up appointment in 3 months. 8)  (update: i left message on phone-tree:  increase allopurinol to 300 mg once daily.  redouble weight-loss efforts.  start metformin 2x500 mg once daily.  minimize colchicine). Prescriptions: METFORMIN HCL 500 MG XR24H-TAB (METFORMIN HCL) 1 once daily  #60 x 11   Entered and Authorized by:   Minus Breeding MD   Signed by:   Minus Breeding MD on 06/01/2009   Method used:   Electronically to        [facility]* (retail)       [address]       [place], [place]  [address]       Ph: [phone]       Fax: [phone]   RxID:   [id number] ALLOPURINOL 300 MG TABS (ALLOPURINOL) 1 once daily  #30 x 11   Entered and Authorized by:   Minus Breeding MD   Signed by:   Minus Breeding MD on 06/01/2009   Method used:   Electronically to        [facility]* (retail)       [address]       [place], [place]  [address]       Ph: [phone]       Fax: [phone]   RxID:   [id number] AMLODIPINE BESYLATE 2.5 MG TABS (AMLODIPINE BESYLATE) 1 once daily  #30 x 11   Entered and Authorized by:   Minus Breeding MD   Signed by:   Minus Breeding MD on 06/01/2009   Method used:   Electronically to        [facility]* (retail)       [address]       [place], [place]  [address]  Ph: [phone]       Fax: [phone]   RxID:   [id number] TRAMADOL HCL 50 MG TABS (TRAMADOL HCL) 1 every 4 hrs as needed pain  #50 x 11   Entered and Authorized by:   Minus Breeding MD   Signed by:   Minus Breeding MD on 06/01/2009   Method used:   Electronically to        [facility]* (retail)       [address]       [place], [place]  [address]       Ph: [phone]       Fax: [phone]   RxID:   [id number]

## 2010-04-20 NOTE — Progress Notes (Signed)
Summary: Test results   Phone Note Call from Patient Call back at Home Phone [phone]   Caller: Patient Summary of Call: test results Initial call taken by: Judie Grieve,  November 20, 2009 10:27 AM  Follow-up for Phone Call        Called patient back and advised that CBC showed a small drop in Hgb/Hct and plat down to 74. Advised patient that I will fax CBC to Dr. Ewing Schlein. Layne Benton, RN, BSN  November 20, 2009 11:40 AM

## 2010-04-20 NOTE — Progress Notes (Signed)
Summary: CBC   Phone Note Outgoing Call   Call placed by: J Tucker Minter RN  Summary of Call: Needs CBC checked in 2 months per Dr.Ross  Follow-up for Phone Call        [facility] for patient to call back to schedule labs Follow-up by: J Hakan Nudelman RN  Additional Follow-up for Phone Call Additional follow up Details #1::        returning call, Migdalia Dk  September 15, 2009 8:53 AM  0830AM--09/15/09--lmtcb--nt 0930am 09/15/09--reached pt and sched. cbc for 11/17/09--nt Additional Follow-up by: Ledon Snare, RN,  September 15, 2009 9:27 AM

## 2010-04-20 NOTE — Letter (Signed)
Summary: [facility]  [facility]   Imported By: Sherian Rein 04/20/2009 11:01:19  _____________________________________________________________________  External Attachment:    Type:   Image     Comment:   External Document

## 2010-04-20 NOTE — Progress Notes (Signed)
  Phone Note Other Incoming   Caller: dr Arbutus Ped Summary of Call: wants to stop pravachol and allopurinol on a trial basis  Follow-up for Phone Call        ok with me please call patient: stop these 2 meds and f/u with dr Arbutus Ped. Follow-up by: Minus Breeding MD,  January 05, 2010 3:40 PM  Additional Follow-up for Phone Call Additional follow up Details #1::        pt informed to stop taking medications and to F/U with Dr. Arbutus Ped Additional Follow-up by: Brenton Grills MA,  January 05, 2010 3:57 PM

## 2010-04-20 NOTE — Letter (Signed)
Summary: [facility]  [facility]   Imported By: Sherian Rein 05/22/2009 13:38:20  _____________________________________________________________________  External Attachment:    Type:   Image     Comment:   External Document

## 2010-04-20 NOTE — Progress Notes (Signed)
Summary: lab results   Phone Note Call from Patient   Caller: Patient [phone] Reason for Call: Talk to Nurse, Lab or Test Results Summary of Call: wants results of lab work done friday- Initial call taken by: Glynda Jaeger,  December 14, 2009 3:52 PM  Follow-up for Phone Call        Will ask Dr.Ross to review labs done under Dr.Ellison and call him back.      Layne Benton, RN, BSN  December 14, 2009 7:01 PM   Additional Follow-up for Phone Call Additional follow up Details #1::        Patient aware that Dr.Ross will review Lipid panel and then I will call him back. He states that Dr.Ellison has told him that he plans on refering him to a hematologist due to low plat. level. Layne Benton, RN, BSN  December 15, 2009 11:44 AM     Additional Follow-up for Phone Call Additional follow up Details #2::    See lab note. Follow-up by: Sherrill Raring, MD, [facility],  December 15, 2009 3:20 PM

## 2010-04-20 NOTE — Assessment & Plan Note (Signed)
Summary: FOLLOW UP-LB   Vital Signs:  Patient profile:   77 year old male Height:      69 inches Weight:      172.25 pounds BMI:     25.53 O2 Sat:      97 % on Room air Temp:     97.6 degrees F oral Pulse rate:   63 / minute BP sitting:   116 / 64  (left arm) Cuff size:   regular  Vitals Entered By: Margaret Pyle, CMA (September 10, 2009 10:41 AM)  O2 Flow:  Room air CC: Follow Up   Primary Provider:  dr Everardo All  CC:  Follow Up.  History of Present Illness: the status of at least 3 ongoing medical problems is addressed today: dm:  no cbg record, but states cbg's vary from 130-200.  pt states he feels well in general. gout:  no recent sxs. he takes allopurinol 100 and 300 mg once daily. htn:  he takes bp meds as rx'ed.  no dizziness.  Current Medications (verified): 1)  Folic Acid 1 Mg  Tabs (Folic Acid) .... Take 1 By Mouth Qd 2)  Prilosec Otc 20 Mg Tbec (Omeprazole Magnesium) .... Qd 3)  Pravastatin Sodium 80 Mg Tabs (Pravastatin Sodium) .... Take 1 By Mouth At Bedtime 4)  Onetouch Ultra Test  Strp (Glucose Blood) .... Check Bs Qid 5)  Onetouch Lancets  Misc (Lancets) .... Use Qid 6)  Benicar 40 Mg Tabs (Olmesartan Medoxomil) .Marland Kitchen.. 1 Once Daily 7)  Amlodipine Besylate 2.5 Mg Tabs (Amlodipine Besylate) .Marland Kitchen.. 1 Once Daily 8)  Allopurinol 300 Mg Tabs (Allopurinol) .Marland Kitchen.. 1 Once Daily 9)  Metformin Hcl 500 Mg Xr24h-Tab (Metformin Hcl) .Marland Kitchen.. 1 Once Daily 10)  Nitrostat 0.4 Mg Subl (Nitroglycerin) .Marland Kitchen.. 1 Tablet Under Tongue At Onset of Chest Pain; You May Repeat Every 5 Minutes For Up To 3 Doses.  Allergies (verified): No Known Drug Allergies  Past History:  Past Medical History: Last updated: 10/08/2007 Coronary artery disease Diabetes mellitus, type II GERD NASh PURE HYPERCHOLESTEROLEMIA (ICD-272.0) GOUT (ICD-274.9) GERD (ICD-530.81) DIABETES MELLITUS, TYPE II (ICD-250.00) CORONARY ARTERY DISEASE (ICD-414.00)  Review of Systems  The patient denies  hypoglycemia.         he has losgt a few lbs, due to his efforts.  Physical Exam  General:  normal appearance.   Extremities:  no edema Additional Exam:  Hemoglobin A1C       [H]  7.3 %                       4.6-6.5 Uric Acid            [L]  2.6 mg/dL      Impression & Recommendations:  Problem # 1:  DIABETES MELLITUS, TYPE II (ICD-250.00) needs increased rx  Problem # 2:  GOUT (ICD-274.9) well-controlled  Problem # 3:  HYPERTENSION (ICD-401.9) well-controlled  Medications Added to Medication List This Visit: 1)  Metformin Hcl 500 Mg Xr24h-tab (Metformin hcl) .... 4 tabs once daily  Other Orders: TLB-A1C / Hgb A1C (Glycohemoglobin) (83036-A1C) TLB-Uric Acid, Blood (84550-URIC) Est. Patient Level IV ([address])  Patient Instructions: 1)  tests are being ordered for you today.  a few days after the test(s), please call [phone] to hear your test results. 2)  if your blood sugar is high, we could increase the metformin, or add januvia or bromocriptine.   3)  Please schedule a physical appointment in 3 months. 4)  same medication for  blood pressure. 5)  you only need to take alopurinol 300 mg once daily, and not the 100 mg. 6)  (update: i left message on phone-tree:  increase metformin to 4x500 mg once daily). Prescriptions: METFORMIN HCL 500 MG XR24H-TAB (METFORMIN HCL) 4 tabs once daily  #120 x 11   Entered and Authorized by:   Minus Breeding MD   Signed by:   Minus Breeding MD on 09/10/2009   Method used:   Electronically to        [facility]* (retail)       [address]       [place], [place]  [address]       Ph: [phone]       Fax: [phone]   RxID:   [id number]

## 2010-04-20 NOTE — Progress Notes (Signed)
Summary: REFILL Allopurinol   Phone Note Refill Request Message from:  Patient on May 13, 2009 4:23 PM  Refills Requested: Medication #1:  ALLOPURINOL 100 MG  TABS 1 1/2 qd SEND TO DRUG [facility] Lawman [phone]  Initial call taken by: Judie Grieve,  May 13, 2009 4:24 PM  Follow-up for Phone Call        Rx faxed to pharmacy Follow-up by: Oswald Hillock,  May 14, 2009 9:13 AM    Prescriptions: ALLOPURINOL 100 MG  TABS (ALLOPURINOL) 1 1/2 qd  #45 x 11   Entered by:   Oswald Hillock   Authorized by:   Sherrill Raring, MD, [facility]   Signed by:   Oswald Hillock on 05/14/2009   Method used:   Electronically to        [facility]* (retail)       [address]       [place], [place]  [address]       Ph: [phone]       Fax: [phone]   RxID:   [id number]

## 2010-04-20 NOTE — Progress Notes (Signed)
Summary: rx refill req  Phone Note Refill Request Message from:  Fax from Pharmacy on January 06, 2010 11:59 AM  Refills Requested: Medication #1:  FOLIC ACID 1 MG  TABS take 1 by mouth qd   Dosage confirmed as above?Dosage Confirmed   Last Refilled: 12/05/2009  Method Requested: Electronic Initial call taken by: Brenton Grills MA,  January 06, 2010 11:59 AM    Prescriptions: FOLIC ACID 1 MG  TABS (FOLIC ACID) take 1 by mouth qd  #30 x 11   Entered by:   Brenton Grills MA   Authorized by:   Minus Breeding MD   Signed by:   Brenton Grills MA on 01/06/2010   Method used:   Electronically to        [facility]* (retail)       [address]       [place], [place]  [address]       Ph: [phone]       Fax: [phone]   RxID:   [id number]

## 2010-04-20 NOTE — Progress Notes (Signed)
Summary: Declined ortho appt   Phone Note Outgoing Call   Call placed by: Dagoberto Reef,  June 19, 2009 4:35 PM Summary of Call: Dr Everardo All, scheduled pt appt for 06/25/09 @ 930am with Dr Simonne Come.Called pt to inform and pt said he could not afford to go at this time,declined appt . Initial call taken by: Dagoberto Reef,  June 19, 2009 4:36 PM

## 2010-04-20 NOTE — Progress Notes (Signed)
Summary: questions re asprin dosage   Phone Note Call from Patient Call back at Home Phone [phone]   Caller: Patient [phone] Reason for Call: Talk to Nurse Summary of Call: dr Tenny Craw wanted pt to see dr Ewing Schlein re asprin-he wanted him to take either 1 a day or 1 every other day-told him to check w/dr ross as to which she would prefer him take Initial call taken by: Glynda Jaeger,  September 09, 2009 4:11 PM  Follow-up for Phone Call        Will ask Dr.Ross on 6/23 and call him back....was on ECASA 81mg  every day in the past. Follow-up by: J REISS RN  Additional Follow-up for Phone Call Additional follow up Details #1::        I would recomm 1 every day if ok per GI as well. Additional Follow-up by: Sherrill Raring, MD, [facility],  September 10, 2009 10:40 AM     Appended Document: questions re asprin dosage LMOM for call back.  Appended Document: questions re asprin dosage Patient aware of above.

## 2010-04-22 NOTE — Progress Notes (Signed)
Summary: pt allergy meds       Allergies Added: ! * AZOR Phone Note Call from Patient Call back at Home Phone [phone]   Caller: Patient Reason for Call: Talk to Nurse, Talk to Doctor Summary of Call: pt was seen yesterday and was told to call with medications that he has a problem with pt is allergic to azor 10/20mg  not takin buspirone hcl 10mg  anymore Initial call taken by: Omer Jack,  April 06, 2010 10:29 AM  Follow-up for Phone Call        Updating Allergies: Patient allergic to Azor. Will update. Whitney Maeola Sarah RN  April 06, 2010 11:53 AM  Follow-up by: Whitney Maeola Sarah RN,  April 06, 2010 11:53 AM   New Allergies: ! * AZOR New Allergies: ! * AZOR  Appended Document: pt allergy meds  In September 2011 he was on Benicar and Amlodipine.  Now he is reportedly allergic to Azor Need note from allergist re allergic rxn.  Question if Benicar is an option again.  Appended Document: pt allergy meds  Called patient and he advised me that when Dr.Ellison saw him he complained of an itchy/rash on his legs so he stopped the Azor because of an allergic reaction. He stopped Benicar due to cost. Will let Dr.Camellia Popescu know.

## 2010-04-22 NOTE — Assessment & Plan Note (Signed)
Summary: rov in January 2012/sl  Medications Added PRAVASTATIN SODIUM 80 MG TABS (PRAVASTATIN SODIUM) 1 tab at bedtime      Allergies Added: NKDA  Visit Type:  Follow-up Primary Provider:  dr Everardo All  CC:  no complaints.  History of Present Illness: Patient is a 77 year old with a history of CAD (s/p CABG in 1996), dyslipidemia, hypertenison, diabetes, mild aortic stenosis and gout.  I last saw him in April.  Note myoview in June 2010 was a low risk scan with only a question of basal inferior ischemia.  Normal perfusion elsewhere. Since seen he has done well from a cardiac standpoint.  He had a couple episdoes of chest pain about a month ago that were relieved quickly with SL NTG.   Breathing is stable.  He is followig with Dr Shirline Frees for platelets  Current Medications (verified): 1)  Folic Acid 1 Mg  Tabs (Folic Acid) .... Take 1 By Mouth Qd 2)  Prilosec Otc 20 Mg Tbec (Omeprazole Magnesium) .... Qd 3)  Onetouch Ultra Test  Strp (Glucose Blood) .... Check Bs Qid 4)  Onetouch Lancets  Misc (Lancets) .... Use Qid 5)  Benicar 40 Mg Tabs (Olmesartan Medoxomil) .Marland Kitchen.. 1 Once Daily 6)  Amlodipine Besylate 2.5 Mg Tabs (Amlodipine Besylate) .Marland Kitchen.. 1 Once Daily 7)  Metformin Hcl 500 Mg Xr24h-Tab (Metformin Hcl) .... 4 Tabs Once Daily 8)  Nitrostat 0.4 Mg Subl (Nitroglycerin) .Marland Kitchen.. 1 Tablet Under Tongue At Onset of Chest Pain; You May Repeat Every 5 Minutes For Up To 3 Doses. 9)  Buspirone Hcl 10 Mg Tabs (Buspirone Hcl) .Marland Kitchen.. 1 Tab Two Times A Day  Allergies (verified): No Known Drug Allergies  Past History:  Past medical, surgical, family and social histories (including risk factors) reviewed, and no changes noted (except as noted below).  Past Medical History: Coronary artery disease Diabetes mellitus, type II GERD NASh PURE HYPERCHOLESTEROLEMIA (ICD-272.0) GOUT (ICD-274.9) GERD (ICD-530.81) DIABETES MELLITUS, TYPE II (ICD-250.00) CORONARY ARTERY DISEASE  (ICD-414.00) Thrombocytopenia  Past Surgical History: Reviewed history from 11/02/2006 and no changes required. Coronary artery bypass graft Stress Cardiolite (08/11/2006 Transthoracic Echocardiogram (08/11/2006) EKG (07/24/2006  Family History: Reviewed history from 11/20/2007 and no changes required. mother had pancreatic cancer sister had colon cancer at advanced age  Social History: Reviewed history from 10/09/2008 and no changes required. married retired No tobacco  Review of Systems       Systems reviewed.  Neg to the above problem except as noted above.  Vital Signs:  Patient profile:   77 year old male Height:      69 inches Weight:      172 pounds BMI:     25.49 Pulse rate:   55 / minute BP sitting:   136 / 50  (left arm) Cuff size:   regular  Vitals Entered By: Hardin Negus, RMA (April 05, 2010 2:19 PM)  Physical Exam  Additional Exam:  Patient is in NAD HEENT:  Normocephalic, atraumatic. EOMI, PERRLA.  Neck: JVP is normal. No thyromegaly. No bruits.  Lungs: clear to auscultation. No rales no wheezes.  Heart: Regular rate and rhythm. Normal S1, S2. No S3.   Gr II/VI systolic murmur. PMI not displaced.  Abdomen:  Supple, nontender. Normal bowel sounds. No masses. No hepatomegaly.  Extremities:   Good distal pulses throughout. No lower extremity edema.  Musculoskeletal :moving all extremities.  Neuro:   alert and oriented x3.    EKG  Procedure date:  04/05/2010  Findings:  Sinus bradycardia.  55 bpm.  Occasional PAC, PVC.  LAFB.  Impression & Recommendations:  Problem # 1:  CORONARY ATHEROSCLEROSIS NATIVE CORONARY ARTERY (ICD-414.01) No symptoms to sugg angina.  I would keep on same regimen.  Remote CABG   Myoview a little over a year ago was without ischemia.  Follow.  Problem # 2:  HYPERLIPIDEMIA-MIXED (ICD-272.4) Lipids have been well controlled.  Continue. His updated medication list for this problem includes:    Pravastatin Sodium 80  Mg Tabs (Pravastatin sodium) .Marland Kitchen... 1 tab at bedtime  Problem # 3:  AORTIC STENOSIS (ICD-424.1) Mild by echo.  I would repeat after next visit.  Other Orders: EKG w/ Interpretation (93000)  Patient Instructions: 1)  Your physician wants you to follow-up in: 11months for  office visit with Dr.Hadasa Gasner and echo. 414.01 424.0  You will receive a reminder letter in the mail two months in advance. If you don't receive a letter, please call our office to schedule the follow-up appointment.

## 2010-04-22 NOTE — Progress Notes (Signed)
Summary: rx refill req  Phone Note Refill Request Message from:  Fax from Pharmacy on April 09, 2010 12:12 PM  Refills Requested: Medication #1:  PRAVASTATIN SODIUM 80 MG TABS 1 tab at bedtime.   Dosage confirmed as above?Dosage Confirmed   Last Refilled: 03/10/2010  Method Requested: Electronic Next Appointment Scheduled: 06/17/2010 Initial call taken by: Brenton Grills CMA Duncan Dull),  April 09, 2010 12:13 PM    Prescriptions: PRAVASTATIN SODIUM 80 MG TABS (PRAVASTATIN SODIUM) 1 tab at bedtime  #30 x 2   Entered by:   Brenton Grills CMA (AAMA)   Authorized by:   Minus Breeding MD   Signed by:   Brenton Grills CMA (AAMA) on 04/09/2010   Method used:   Electronically to        [facility]* (retail)       [address]       [place], [place]  [address]       Ph: [phone]       Fax: [phone]   RxID:   [id number]

## 2010-04-23 NOTE — Letter (Signed)
Summary: [facility]  [facility]   Imported By: Sherian Rein 05/27/2009 14:43:10  _____________________________________________________________________  External Attachment:    Type:   Image     Comment:   External Document

## 2010-04-23 NOTE — Procedures (Signed)
Summary: [facility]  [facility]   Imported By: Lester Rogers 04/14/2009 16:36:48  _____________________________________________________________________  External Attachment:    Type:   Image     Comment:   External Document

## 2010-04-23 NOTE — Letter (Signed)
Summary: [facility]  [facility]   Imported By: Lester [place] 01/26/2010 08:21:36  _____________________________________________________________________  External Attachment:    Type:   Image     Comment:   External Document

## 2010-04-23 NOTE — Letter (Signed)
Summary: [facility]   Imported By: Sherian Rein 10/23/2009 10:22:13  _____________________________________________________________________  External Attachment:    Type:   Image     Comment:   External Document

## 2010-04-27 ENCOUNTER — Telehealth: Payer: Self-pay | Admitting: Endocrinology

## 2010-04-29 ENCOUNTER — Telehealth: Payer: Self-pay | Admitting: Endocrinology

## 2010-05-06 NOTE — Progress Notes (Signed)
Summary: rx refill req  Phone Note Refill Request Message from:  Fax from Pharmacy on April 27, 2010 11:12 AM  Refills Requested: Medication #1:  AMLODIPINE BESYLATE 2.5 MG TABS 1 once daily   Dosage confirmed as above?Dosage Confirmed   Last Refilled: 03/29/2010  Method Requested: Electronic Next Appointment Scheduled: 06/17/2010 Initial call taken by: Brenton Grills CMA (AAMA),  April 27, 2010 11:12 AM    Prescriptions: AMLODIPINE BESYLATE 2.5 MG TABS (AMLODIPINE BESYLATE) 1 once daily  #30 x 5   Entered by:   Brenton Grills CMA (AAMA)   Authorized by:   Minus Breeding MD   Signed by:   Brenton Grills CMA (AAMA) on 04/27/2010   Method used:   Electronically to        [facility]* (retail)       [address]       [place], [place]  [address]       Ph: [phone]       Fax: [phone]   RxID:   [id number]

## 2010-05-06 NOTE — Progress Notes (Signed)
Summary: med ?  Phone Note From Pharmacy   Caller: [facility] Summary of Call: Refill request sent from [facility] for pt's Allopurinol 300mg  tablets. Pt was advised to stop this drug in 12/2009 by Dr. Arbutus Ped on a trial basis-please advise Initial call taken by: Brenton Grills CMA Duncan Dull),  April 29, 2010 12:05 PM  Follow-up for Phone Call        please d/c this med Follow-up by: Minus Breeding MD,  April 29, 2010 12:25 PM

## 2010-05-19 ENCOUNTER — Encounter (HOSPITAL_BASED_OUTPATIENT_CLINIC_OR_DEPARTMENT_OTHER): Payer: MEDICARE | Admitting: Internal Medicine

## 2010-05-19 ENCOUNTER — Other Ambulatory Visit: Payer: Self-pay | Admitting: Internal Medicine

## 2010-05-19 ENCOUNTER — Encounter: Payer: Self-pay | Admitting: Endocrinology

## 2010-05-19 DIAGNOSIS — D696 Thrombocytopenia, unspecified: Secondary | ICD-10-CM

## 2010-05-19 DIAGNOSIS — D61818 Other pancytopenia: Secondary | ICD-10-CM

## 2010-05-19 LAB — CBC WITH DIFFERENTIAL/PLATELET
Basophils Absolute: 0 10*3/uL (ref 0.0–0.1)
EOS%: 2.7 % (ref 0.0–7.0)
Eosinophils Absolute: 0.1 10*3/uL (ref 0.0–0.5)
LYMPH%: 28.2 % (ref 14.0–49.0)
MCH: 31.7 pg (ref 27.2–33.4)
MCV: 93.5 fL (ref 79.3–98.0)
MONO%: 7.1 % (ref 0.0–14.0)
Platelets: 67 10*3/uL — ABNORMAL LOW (ref 140–400)
RBC: 3.59 10*6/uL — ABNORMAL LOW (ref 4.20–5.82)
RDW: 16.5 % — ABNORMAL HIGH (ref 11.0–14.6)

## 2010-05-19 LAB — COMPREHENSIVE METABOLIC PANEL
AST: 23 U/L (ref 0–37)
Albumin: 4.1 g/dL (ref 3.5–5.2)
Alkaline Phosphatase: 133 U/L — ABNORMAL HIGH (ref 39–117)
BUN: 14 mg/dL (ref 6–23)
Glucose, Bld: 138 mg/dL — ABNORMAL HIGH (ref 70–99)
Potassium: 4 mEq/L (ref 3.5–5.3)
Total Bilirubin: 1.2 mg/dL (ref 0.3–1.2)

## 2010-05-20 ENCOUNTER — Telehealth: Payer: Self-pay | Admitting: Internal Medicine

## 2010-05-20 ENCOUNTER — Encounter: Payer: Self-pay | Admitting: Internal Medicine

## 2010-05-27 NOTE — Miscellaneous (Signed)
  Clinical Lists Changes  Medications: Changed medication from PRAVASTATIN SODIUM 80 MG TABS (PRAVASTATIN SODIUM) 1 tab at bedtime to PRAVASTATIN SODIUM 80 MG TABS (PRAVASTATIN SODIUM) 1 half tab at bedtime

## 2010-05-27 NOTE — Progress Notes (Signed)
Summary: pt has med change question   Phone Note Call from Patient Message from:  Patient  Caller: Patient [phone] ok to leave message  Reason for Call: Talk to Nurse Summary of Call: pt saw pcp, his blood platlets are down, requesting to cut the pravastatin in half to 40mg ? Initial call taken by: Glynda Jaeger,  May 20, 2010 9:31 AM  Follow-up for Phone Call        I was not aware that Pravastatin affected platelets.  OK to cut back for now.  Will check with pharmacy. Follow-up by: Sherrill Raring, MD, [facility],  May 20, 2010 1:52 PM     Appended Document: pt has med change question The package insert for pravastatin includes a blanet statement that "statins may cause thrombocytopenia."  The only actual rate I could locate stated 2.29% of pts on pravastatin had thrombocytopenia (out of 5000+ patients).  No causal relationship was given in that report.   Appended Document: pt has med change question Called patient. He will cut back on Pravachol to 40mg  every day. He will have his blood count checked again in May.

## 2010-05-28 ENCOUNTER — Telehealth: Payer: Self-pay | Admitting: Internal Medicine

## 2010-06-01 LAB — GLUCOSE, CAPILLARY: Glucose-Capillary: 215 mg/dL — ABNORMAL HIGH (ref 70–99)

## 2010-06-01 LAB — CBC
Platelets: 88 10*3/uL — ABNORMAL LOW (ref 150–400)
RBC: 3.68 MIL/uL — ABNORMAL LOW (ref 4.22–5.81)
WBC: 3.1 10*3/uL — ABNORMAL LOW (ref 4.0–10.5)

## 2010-06-01 LAB — DIFFERENTIAL
Lymphocytes Relative: 26 % (ref 12–46)
Lymphs Abs: 0.8 10*3/uL (ref 0.7–4.0)
Neutrophils Relative %: 60 % (ref 43–77)

## 2010-06-01 LAB — CHROMOSOME ANALYSIS, BONE MARROW

## 2010-06-08 NOTE — Letter (Signed)
Summary: [facility]  [facility]   Imported By: Sherian Rein 06/03/2010 10:46:41  _____________________________________________________________________  External Attachment:    Type:   Image     Comment:   External Document

## 2010-06-08 NOTE — Progress Notes (Signed)
Summary: pt meds no longer made   Phone Note Refill Request Call back at Home Phone [phone] Message from:  Patient  Refills Requested: Medication #1:  allopurinol 100mg  pt went to drug store and was told they don't make this medication anymore so he needs to know what to do  Initial call taken by: Omer Jack,  May 28, 2010 4:08 PM  Follow-up for Phone Call        PT CALLING BACK REGARDING QUESTIONS ABOUT MEDICATION  Judie Grieve  May 31, 2010 10:41 AM  spoke w[facility], they state refill was denied, med list show med was stopped 12/2009 per Dr Gwenyth Bouillon, discussed w/pt he states he was on allopurinol 100mg  1 & 1/2 tabs daily and in 05/2009 Dr Everardo All added 300mg  daily and then in 12/2009 the 300mg  was stopped but pt cont. on w/100mg  1 & 1/2 tabs daily, advised pt was suppose to have stopped 100mg  in March and started on the 300mg  and then it was suppose to have been stopped in Oct.  He states he recently saw Dr Gwenyth Bouillon and that he knows pt is on the 100mg  1 & 1/2 tabs daily, advised pt he needs to f/u w/Dr Everardo All to get refills he is agreeable  Follow-up by: Meredith Staggers, RN,  May 31, 2010 10:59 AM     Appended Document: pt meds no longer made Medications Added ALLOPURINOL 300 MG TABS (ALLOPURINOL) 1/2 tab once daily          Clinical Lists Changes  Medications: Added new medication of ALLOPURINOL 300 MG TABS (ALLOPURINOL) 1/2 tab once daily - Signed Rx of ALLOPURINOL 300 MG TABS (ALLOPURINOL) 1/2 tab once daily;  #30 x 5;  Signed;  Entered by: Minus Breeding MD;  Authorized by: Minus Breeding MD;  Method used: Electronically to [facility]*, [address] [place], [place], [place]  [address], Ph: [phone], Fax: [phone]    Prescriptions: ALLOPURINOL 300 MG TABS (ALLOPURINOL) 1/2 tab once daily  #30 x 5   Entered and Authorized by:   Minus Breeding MD   Signed by:   Minus Breeding MD on 05/31/2010   Method used:    Electronically to        [facility]* (retail)       [address]       [place], [place]  [address]       Ph: [phone]       Fax: [phone]   RxID:   [id number]  please call patient: i re-wrote for 1/2 of 300 mg once daily  Pt informed. Margaret Pyle, CMA  June 01, 2010 10:21 AM

## 2010-06-17 ENCOUNTER — Encounter: Payer: Self-pay | Admitting: Endocrinology

## 2010-06-17 ENCOUNTER — Ambulatory Visit (INDEPENDENT_AMBULATORY_CARE_PROVIDER_SITE_OTHER): Payer: MEDICARE | Admitting: Endocrinology

## 2010-06-17 ENCOUNTER — Other Ambulatory Visit (INDEPENDENT_AMBULATORY_CARE_PROVIDER_SITE_OTHER): Payer: MEDICARE

## 2010-06-17 VITALS — BP 118/74 | HR 77 | Temp 98.4°F | Ht 67.0 in | Wt 174.8 lb

## 2010-06-17 DIAGNOSIS — E119 Type 2 diabetes mellitus without complications: Secondary | ICD-10-CM

## 2010-06-17 DIAGNOSIS — M109 Gout, unspecified: Secondary | ICD-10-CM

## 2010-06-17 DIAGNOSIS — R972 Elevated prostate specific antigen [PSA]: Secondary | ICD-10-CM | POA: Insufficient documentation

## 2010-06-17 LAB — PSA: PSA: 0.55 ng/mL (ref 0.10–4.00)

## 2010-06-17 NOTE — Progress Notes (Signed)
  Subjective:    Patient ID: Matthew Singh, male    DOB: 27-Dec-1932, 77 y.o.   MRN: [id number]  HPI The state of at least three ongoing medical problems is addressed today: Since off the colchicine and allopurinol, he has had not had any gout probs. Pt says cbg's have been in the mid to high-100's.   Pt stopped buspar, anxiety sxs are much better. Past Medical History  Diagnosis Date  . NASH (nonalcoholic steatohepatitis)   . DIABETES MELLITUS, TYPE II 11/02/2006  . Pure hypercholesterolemia 10/08/2007  . HYPERLIPIDEMIA-MIXED 07/02/2009  . GOUT 06/14/2007  . THROMBOCYTOPENIA 12/17/2008  . HYPERTENSION 10/08/2007  . CORONARY ARTERY DISEASE 11/02/2006  . CORONARY ATHEROSCLEROSIS NATIVE CORONARY ARTERY 11/17/2009  . AORTIC STENOSIS 07/02/2009  . GERD 11/02/2006  . FATTY LIVER DISEASE 10/08/2007  . HYPERTROPHY PROSTATE W/UR OBST & OTH LUTS 02/21/2009   Past Surgical History  Procedure Date  . Coronary artery bypass graft   . Stress cardiolite 08/11/2006  . Transthoracic echocardiogram 08/11/2006    reports that he has never smoked. He does not have any smokeless tobacco history on file. His alcohol and drug histories not on file. family history includes Cancer in his mother and sister. Allergies  Allergen Reactions  . Azor (Amlodipine-Olmesartan) Rash    Review of Systems Denies weight change and insomnia    Objective:   Physical Exam GENERAL: no distress. Pulses: dorsalis pedis intact bilat.   Feet: no deformity.  no ulcer on the feet.  feet are of normal color and temp.  no edema Neuro: sensation is intact to touch on the feet.     Lab Results  Component Value Date   WBC 3.1* 02/05/2010   HGB 11.4* 05/19/2010   HCT 33.5* 05/19/2010   PLT 67* 05/19/2010   CHOL 130 12/11/2009   TRIG 71.0 12/11/2009   HDL 42.70 12/11/2009   ALT 13 05/19/2010   ALT 13 05/19/2010   AST 23 05/19/2010   AST 23 05/19/2010   NA 140 05/19/2010   NA 140 05/19/2010   K 4.0 05/19/2010   K 4.0 05/19/2010   CL  105 05/19/2010   CL 105 05/19/2010   CREATININE 1.30 05/19/2010   CREATININE 1.30 05/19/2010   BUN 14 05/19/2010   BUN 14 05/19/2010   CO2 21 05/19/2010   CO2 21 05/19/2010   TSH 2.71 12/11/2009   PSA 0.55 06/17/2010   HGBA1C 7.1* 06/17/2010   MICROALBUR 1.9 12/11/2009      Assessment & Plan:  Gout, well-controlled off meds for this Anxiety, improved Dm, needs increased rx

## 2010-06-17 NOTE — Patient Instructions (Addendum)
blood tests are being ordered for you today.  please call [phone] to hear your test results. pending the test results, please continue the same medications for now. Your next physical is due in 6 months (update: i left message on phone-tree:  You don't need allopurinol.  You should add actos or [place]).

## 2010-06-21 ENCOUNTER — Telehealth: Payer: Self-pay

## 2010-06-21 MED ORDER — SITAGLIPTIN PHOSPHATE 100 MG PO TABS: 100.0000 mg | ORAL_TABLET | Freq: Every day | ORAL | Status: DC

## 2010-06-21 NOTE — Telephone Encounter (Signed)
Pt advised of MD's recommendation and requested to call back and advised of either medication

## 2010-06-21 NOTE — Telephone Encounter (Signed)
i sent rx 

## 2010-06-21 NOTE — Telephone Encounter (Signed)
Pt called back stating he would prefer to start Januvia

## 2010-06-21 NOTE — Telephone Encounter (Signed)
Glipizide would cause low-blood sugar, at this level of a1c Options are addition of actos or [place].  There are brand-name, but would help the blood sugar without putting it too low.

## 2010-06-21 NOTE — Telephone Encounter (Signed)
Pt called stating her retrieved PT message and is requesting MD Rx Glipizide as he has used this medication in the past. If not he will start medication MD thinks is best.

## 2010-06-29 ENCOUNTER — Other Ambulatory Visit: Payer: Self-pay | Admitting: Endocrinology

## 2010-06-29 MED ORDER — PRAVASTATIN SODIUM 80 MG PO TABS: ORAL_TABLET | ORAL | Status: DC

## 2010-06-29 NOTE — Telephone Encounter (Signed)
R'cd fax from [facility] for refill of pt's Pravastatin.  Last OV-12/11/2009  Last filled:04/09/2010

## 2010-07-23 ENCOUNTER — Telehealth: Payer: Self-pay | Admitting: Endocrinology

## 2010-07-23 NOTE — Telephone Encounter (Signed)
Per caller request, faxed last labs & OV notes.

## 2010-08-03 NOTE — Assessment & Plan Note (Signed)
[facility]                            CARDIOLOGY OFFICE NOTE   Culver, Feighner KOBE OFALLON                     MRN:          [id number]  DATE:08/17/2007                            DOB:          11-02-1932    IDENTIFICATION:  The patient is a 77 year old gentleman.  I last saw him  back in December of last year.  He has a history of coronary artery  disease (status post CABG in 1996), dyslipidemia, diabetes and gout.   Since seen, I have been in touch with him about his blood pressure.  His  Hyzaar was no longer approved.  I switched him over to Benicar.  He says  he has really been coughing.   He denies chest pain.  No significant shortness of breath.  Cough is  nonproductive but very annoying.   CURRENT MEDICINES:  1. Folic acid 1.  2. Protonix 40.  3. Aspirin 81.  4. Actos 45.  5. Glipizide 2.5.  6. Benicar/hydrochlorothiazide 40/25?  7. Pravastatin 80.   PHYSICAL EXAM:  The patient is in no distress.  Blood pressure 141/80,  pulse is 73, weight 190, up 6 pounds from previous.  LUNGS:  Clear without rales or wheezes.  CARDIAC EXAM:  Regular rate and rhythm, S1 and S2, no murmurs.  ABDOMEN:  Benign.  EXTREMITIES:  No edema.   A 12-lead EKG shows normal sinus rhythm, 79 beats per minute.  First-  degree AV block with a PR interval of 262 milliseconds.  Occasional  PVCs.   IMPRESSION:  1. Hypertension.  His cough is significant even talking to him today.      I am not sure if it is the Benicar.  It would be unlikely      mechanistically, but I would taken off it and give him a trial of      Hyzaar again.  He is to call back and see how he does.  2. Dyslipidemia.  Will need to see when his last lipids were checked      by Dr. Everardo All or if they were at [facility].  I thought he      was on 30; he is on 80 of pravastatin.  Will need to follow up.  3. Diabetes, followed by Dr. Everardo All.   I will set follow-up for the fall.  Again, tried  to situate him so that  he is not coughing.   Note, normal Myoview back in 2008.  Will have periodic screening.     Pricilla Riffle, MD, [facility]  Electronically Signed    PVR/MedQ  DD: 08/17/2007  DT: 08/17/2007  Job #: [id number]

## 2010-08-03 NOTE — Assessment & Plan Note (Signed)
[facility]                            CARDIOLOGY OFFICE NOTE   Matthew Singh, Matthew Singh                     MRN:          [id number]  DATE:02/22/2008                            DOB:          [birth year]    IDENTIFICATION:  Matthew Singh is a 77 year old gentleman.  I last saw him  back in May.  He has a history of CAD (status post CABG in 1996; last  Myoview scan in 2008 showed no ischemia), dyslipidemia, diabetes, and  gout.  He also has a history of hypertension.   Since seen, he has been doing well.  He continues to walk regularly  about 3 miles per day.  He denies chest pain.  Breathing is okay.  Cough  has gone.  Now, that he is on Hyzaar.  Note, he did state that Dr.  Everardo All cut back on his dose.   CURRENT MEDICINES:  1. Folic acid 1 daily.  2. Glipizide 2.5.  3. Pravastatin 80.  4. Omeprazole 20.  5. Hyzaar, question 20/25.   PHYSICAL EXAMINATION:  GENERAL:  The patient is in no distress.  VITAL SIGNS:  Blood pressure 148/78; pulse is 64 and regular; and weight  185, which is down 5 pounds from previous.  NECK:  No JVD or bruits.  LUNGS:  Clear without rales.  CARDIAC:  Regular rate and rhythm, grade 2/6 systolic murmur, early  peaking and heard best at the base.  ABDOMEN:  Benign.  No hepatomegaly.  Normal bowel sounds.  EXTREMITIES:  Good distal pulses.  No edema.   A 12-lead EKG shows normal sinus rhythm at 66 beats per minute, first-  degree AV block with PR interval of 238 milliseconds.  Occasional PVC.   IMPRESSION:  1. Coronary artery disease, doing very well.  He is now 13 years post      bypass.  His activity has not changed.  He denies chest pain.  When      I see me again, I will go ahead and get a stress Myoview to      reevaluate for graft patency.  He should call if his symptoms      change or worsen prior.  2. Dyslipidemia.  Last lipid panel done in June was excellent with an      LDL of 75, particle number of 621; his  HDL was 47; and      triglycerides of 77.  3. Diabetes.  Working with Dr. Everardo All on this as his sugars are up      and down.  4. Hypertension, I will need to review why the changes were made.  He      is now without cough.  Blood pressure is a little bit up, but again      I am reluctant to make any changes without finding out first what      had gone on.   I will set to see the patient back again in 6 months with a stress  Myoview on that day.     Pricilla Riffle, MD, [facility]  Electronically Signed  PVR/MedQ  DD: 02/22/2008  DT: 02/23/2008  Job #: [id number]   cc:   Gregary Signs A. Everardo All, MD

## 2010-08-03 NOTE — Assessment & Plan Note (Signed)
[facility]                            CARDIOLOGY OFFICE NOTE   Matthew, Boeve [name]                     MRN:          [id number]  DATE:02/09/2007                            DOB:          Oct 24, 1932    IDENTIFICATION:  The patient is a 77 year old gentleman last seen in  cardiology clinic in May.  He had a stress Myoview after this.  He has a  history of coronary artery disease status post CABG x4 in 1996.   Myoview scan in May showed small region of inferobasilar ischemic  question relevance, similar to scan in 2005.   Since seen he denies chest pain, no significant shortness of breath.   CURRENT MEDICATIONS:  1. Folic acid.  2. Protonix 40.  3. Aspirin 81.  4. Hyzaar 100/25.  5. Multivitamin.  6. Actos 45.  7. Indocin p.r.n.  8. The patient is out of Pravastatin.   PHYSICAL EXAMINATION:  GENERAL:  The patient is in no distress.  VITAL SIGNS:  Blood pressure 121/74, pulse 91, weight 187.  LUNGS:  Clear.  HEART:  Regular rate and rhythm, S1 and S2.  No S3 or murmurs.  ABDOMEN:  Benign.  Reducible hernia.  EXTREMITIES:  No lower extremity edema.   IMPRESSION:  1. Coronary artery disease, remote bypass in 1996.  Myoview scan looks      good.  Would continue on current regimen.  2. Dyslipidemia.  Refill statin prescription.  3. Diabetes followed in endocrine.   I will set follow-up for this summer or sooner if problems develop.     Pricilla Riffle, MD, Matthew Singh  Electronically Signed    PVR/MedQ  DD: 02/11/2007  DT: 02/11/2007  Job #: [id number]

## 2010-08-03 NOTE — Assessment & Plan Note (Signed)
[facility]                            CARDIOLOGY OFFICE NOTE   Matthew Singh, Matthew Singh                     MRN:          [id number]  DATE:03/05/2007                            DOB:          05/25/1932    IDENTIFICATION:  Matthew Singh is a 77 year old gentleman.  I actually saw  him on February 09, 2007, scheduled to see him in June, though I think  there might have been a miscommunication at the front desk that he is  brought back sooner.  He is status post CABG in 1996.  Myoview in May of  this year showed a question of a small region of inferobasilar ischemia,  similar to scan in 2005.   The patient denies any chest pain, no shortness of breath.  Does  complain of some gout in his left knee which he has had before.   CURRENT MEDICATIONS:  1. Folic acid daily.  2. Protonix 40 daily.  3. Aspirin 81.  4. Hyzaar 100/25 daily.  5. Multivitamin.  6. Actos 45.  7. Pravastatin 40.  8. Indocin just started for a few days.  9. Glipizide 2.5 (new).   PHYSICAL EXAMINATION:  VITAL SIGNS:  Blood pressure 142/83, pulse 78,  weight 184 (down 3 pounds from previous).  GENERAL APPEARANCE:  Patient is in no distress.  LUNGS:  Clear.  CARDIOVASCULAR:  Regular rate and rhythm, S1 and S2, no S3.  ABDOMEN:  Benign.  EXTREMITIES:  Left knee is swollen and hot.  No edema distally.   IMPRESSION:  1. Coronary artery disease stable.  Again, I will follow up in the      summer.  2. Dyslipidemia, on Statin.  3. Diabetes, followed by endocrine.  4. Gout.   I discourage him from taking the Indocin as his creatinine is 1.65  (stable).  Given a prescription for prednisone 40 daily x4 days, then  start allopurinol 150 daily.  If he has any problems or gets  worse, call sooner.  Otherwise, again, follow up in the summer.  No  charge for today's visit since I really did not need to see him from a  cardiac standpoint.     Pricilla Riffle, MD, [facility]  Electronically  Signed    PVR/MedQ  DD: 03/05/2007  DT: 03/06/2007  Job #: [id number]

## 2010-08-03 NOTE — Consult Note (Signed)
[facility]                          ENDOCRINOLOGY CONSULTATION   Matthew Singh, Matthew Singh                     MRN:          [id number]  DATE:10/10/2006                            DOB:          28-Jan-1933    REASON FOR VISIT:  Follow up diabetes.   HISTORY OF PRESENT ILLNESS:  A 77 year old man who states his diabetes  is well controlled on Actos 45 and glipizide 5 mg a day.   He also has a history of NASH which has improved with Actos.   Regarding his dyslipidemia, he currently takes only Pravachol 80 mg a  day in the hope that the Actos could replace the Lovaza.   PAST MEDICAL HISTORY:  1. GERD.  2. Hypertension.  3. Dyslipidemia.  4. NASH.  5. CAD.   REVIEW OF SYSTEMS:  Denies chest pain, shortness of breath, and  hypoglycemia.   PHYSICAL EXAMINATION:  Blood pressure 141/73, heart rate 74, temperature  97.6.  Weight is 184.  GENERAL:  No distress.  EXTREMITIES:  No edema.   LABORATORY STUDIES:  On October 10, 2006, LFTs are normal except for a GOT  borderline elevated at 40.  Total cholesterol 142, triglycerides 149,  HDL 332, LDL 81, A1C 5.9.   IMPRESSION:  1. Diabetes that is over-controlled.  2. Improvement in his nonalcoholic steatohepatitis continues with his      Actos.  3. Dyslipidemia, now well controlled on Pravachol alone.  It looks      like the effect of the Actos has replaced the need for Lovaza.   PLAN:  1. Reduce glipizide to 2.5 mg every morning.  2. Continue other medications.  3. Stay off the Lovaza.  4. Return in about four months.     Sean A. Everardo All, MD  Electronically Signed    SAE/MedQ  DD: 10/11/2006  DT: 10/12/2006  Job #: [id number]   cc:   [facility]

## 2010-08-06 NOTE — Consult Note (Signed)
[name]                          ENDOCRINOLOGY CONSULTATION   Matthew Singh, Matthew Singh                     MRN:          [id number]  DATE:05/19/2006                            DOB:          Jan 24, 1933    REFERRING PHYSICIAN:  Ashley Royalty, M.D.   HISTORY OF PRESENT ILLNESS:  A 77 year old man who states he was  diagnosed with type 2 diabetes about 3 weeks ago when he presented with  a glucose of about 500.  He was treated with glipizide with improvement  in his glucose to the 100s.  He states he was feeling poorly when his  glucose was high and is feeling much better now.  However, he has some  tremor when his glucose improves to the 100s.   Regarding his dyslipidemia, he was also noted to have a vastly elevated  triglyceride level and was prescribed Lovaza and omega-3 supplement.   He was also noted to have elevated hepatic transaminases and an elevated  creatinine on his recent laboratory studies in [place].   PAST MEDICAL HISTORY:  1. GERD.  2. Hypertension.   SOCIAL HISTORY:  He is married.  He is retired.  He is accompanied by  his wife today.   FAMILY HISTORY:  Positive for diabetes in two of his sons.   REVIEW OF SYSTEMS:  He had some numbness of his toes when his glucose  was elevated, but he states this is much better since his glucose has  improved.  He denies the following:  Weight gain, weight loss, syncope,  chest pain, abdominal pain, and hypoglycemia.   PHYSICAL EXAMINATION:  VITAL SIGNS:  Blood pressure 115/75, heart rate  87, temperature is 97.4, the weight is 170.  GENERAL:  No distress.  SKIN:  No rash, not diaphoretic.  HEENT:  No proptosis, no periorbital swelling.  Pharynx is normal.  NECK:  No goiter.  CHEST:  Clear to auscultation.  No respiratory distress.  CARDIOVASCULAR:  No JVD, no edema.  Regular rate and rhythm.  No murmur.  Pedal pulses are intact.  There is no bruit at the carotid arteries.  EXTREMITIES:   Feet normal color and temperature.  There is no ulcer  present on the feet.  He has onychomycosis of both feet.  NEUROLOGIC:  Alert, well-oriented.  Does not appear anxious nor  depressed, and sensation is intact to touch on the feet.   Laboratory studies forwarded by Dr. Ashley Royalty:  On April 18, 2006,  creatinine 2.0, AST 69, ALT 61, triglycerides 346, GGT 372.  Glucose  546.  Hepatitis studies are negative.   IMPRESSION:  1. Type 2 diabetes, significantly improved on glipizide.  2. His elevated triglycerides probably will improve with this      improvement in his glucose.  3. Given the pattern of his elevated hepatic transaminases, they do      not appear to be completely explained by his diabetes/nonalcoholic      hepatosteatosis.  4. He cannot take metformin due to his renal insufficiency.  5. Slight numbness of the feet.  6. History of coronary artery disease.   PLAN:  1. We discussed the risk of diabetes and treatment options.  2. I advised abdominal ultrasound if it has not been done already.  3. Add Actos 45 mg a day.  This will also help his hepatic      transaminases and triglyceride levels.  4. Decrease glipizide if tolerated by your glucoses.  5. Return 30 days.  6. Refer to cardiology to follow up your CAD.  7. We discussed the risk of diabetes and the importance of diet and      exercise therapy.     Sean A. Everardo All, MD  Electronically Signed    SAE/MedQ  DD: 05/21/2006  DT: 05/22/2006  Job #: [id number]   cc:   Dr. Ashley Royalty

## 2010-08-06 NOTE — Procedures (Signed)
[place]  Patient:    HERNDON, GRILL                       MRN: [id number] Proc. Date: 08/03/00 Adm. Date:  [id number] Disc. Date: [id number] Attending:  Nelda Marseille CC:         Monica Becton, M.D.  Excell Seltzer. Annabell Howells, M.D.   Procedure Report  PROCEDURE:  Colonoscopy with polypectomy.  INDICATIONS:  Guaiac positivity.  INFORMED CONSENT:  Consent was signed after risk, benefits, methods and options were thoroughly discussed in the office.  MEDICINES USED:  Demerol 50 mg, Versed 5 mg.  DESCRIPTION OF PROCEDURE:  Rectal inspection is pertinent for small external hemorrhoids.  Digital examination was negative.  The video colonoscope was inserted, and easily advanced from the colon to the cecum.  This did not require any abdominal pressure or any position changes.  No abnormalities were seen on insertion.  The scope was inserted a short ways in the terminal ileum which was normal.  Further augmentation was obtained.  The scope was slowly withdrawn.  The cecum was identified by the appendiceal orifice and the ileocecal valve.  The scope was slowly withdrawn after advancing in the terminal ileum as mentioned above.  Slow withdrawal through the colon the cecum and ascending was normal and the more distal part of the transverse.  A 3 mm polyp was seen.  Snare electrocautery applied and the polyp resected through the scope and collected in the trap.  The scope was slowly withdrawn and in the mid descending a 2 mm polyp was seen and was hot biopsied x1 and put in the same contained.  The scope was further withdrawn.  No other abnormalities were seen as we slowly withdrew back to the rectum.  Once back in the rectum the scope was then retroflexed.  Pertinent for some internal hemorrhoids.  The scope was straightened and anal rectal pull-through confirmed the hemorrhoids.  The scope was reinserted a short ways up the left side of the colon.   Air was suctioned and the scope removed.  The patient tolerated the procedure well.  There was no evidence of immediate complication.  ENDOSCOPIC DIAGNOSES: 1. Small internal and external hemorrhoids. 2. Two small polyps, hot biopsy in the descending, snare in the distal    transverse. 3. Otherwise within normal limits in the terminal ileum.  PLAN:  Await pathology to determine future colonic screening in one week customary to post polypectomy instructions.  Probably proceed with an upper GI small-bowel follow-through ______ his slight micro ______ indices.  We will go ahead and put him on a multivitamin plus iron and then see him back in the office to recheck guaiacs in 6-8 weeks and make sure no further work-up plans are needed, possible repeat CBC at that juncture. DD:  08/03/00 TD:  08/05/00 Job: [id number]

## 2010-08-06 NOTE — Assessment & Plan Note (Signed)
HEALTHCARE                            CARDIOLOGY OFFICE NOTE   Ascher, Schroepfer ZAKHAI MEISINGER                     MRN:          [id number]  DATE:07/24/2006                            DOB:          12-21-1932    IDENTIFICATION:  Matthew Singh is a 77 year old gentleman whom I actually  last saw in the cardiology clinic back in March of 2005.  He has a  history of coronary artery disease status post CABG in 1996.  The last  Myoview scan was in 2005 and was normal.   The patient is followed now closely by Dr. Romero Belling for glucose  control and lipid control.   In talking to the patient, he denies chest pain.  No indigestion.  He  does get some short of breath, but thinks it may be more due to his  weight.  He walks 3 miles per day in 45 minutes.  No change in this.   He denies palpitations.  No dizziness.   CURRENT MEDICATIONS:  1. Folic acid daily.  2. Protonix 40 daily.  3. Aspirin 81 mg daily.  4. Glipizide 5 daily.  5. Indocin 25 t.i.d.  6. Pravastatin 40 daily.  7. Actos 45 daily.  8. Lovaza daily.  9. Multivitamin daily.  10.Hyzaar 100/25 daily.   PHYSICAL EXAMINATION:  GENERAL:  The patient is in no distress.  VITAL SIGNS:  Blood pressure is 129/75, pulse is 82 with occasional  skips, weight 176, which was up from 155 in 2005.  NECK:  No audible bruits.  JVP is normal.  LUNGS:  Clear to auscultation.  CARDIAC:  Distant heart sounds.  S1 and S2.  Occasional skips.  No S3,  S4 or murmurs.  ABDOMEN:  Supple.  Ventral hernia that reduces easily.  No hepatomegaly.  EXTREMITIES:  Good posterior tibial artery pulses.  No lower extremity  edema.   IMPRESSION:  1. Coronary artery disease.  Remote bypass.  The patient appears      asymptomatic, but again with remote bypass, would go ahead and      schedule a Myoview scan to evaluate patency of his grafts.  With      this, I will schedule an echocardiogram.  Given his frequent skips,      may not  be able to gait.  2. Dyslipidemia.  Review of his most recent lipids on July 11, 2006      revealed his LDL was 112, HDL 43, triglycerides 72.  Would increase      Pravachol to 80 mg.  Follow up in 10 weeks.  3. Hypertension.  Adequate control.  4. Diabetes, followed by Dr. Everardo All.  5. Health care maintenance.  Will schedule for a carotid ultrasound,      given, again, his risk factors.  Will be in touch with him with the      results.  6. Ectopy.  EKG today shows sinus rhythm, 80 beats per minute, first      degree AV block with PR interval of 244 ms.  Frequent PVC's.  Will      review the  Cardiolite results, but also check a BMET; if not done,      will schedule when he returns for the scan.  I will be in touch      with the patient with the results.  Tentatively follow up in 6      months.  Again, depending on the results, follow up may be needed      sooner.     Pricilla Riffle, MD, [facility]  Electronically Signed    PVR/MedQ  DD: 07/24/2006  DT: 07/25/2006  Job #: [id number]

## 2010-08-06 NOTE — Op Note (Signed)
[place]  Patient:    Matthew Singh, Matthew Singh Visit Number: [id number] MRN: [id number]          Service Type: END Location: ENDO Attending Physician:  Nelda Marseille Dictated by:   Petra Kuba, M.D. Proc. Date: 07/11/01 Admit Date:  07/11/2001   CC:         Monica Becton, M.D.   Operative Report  PROCEDURE:  EGD with biopsy.  INDICATIONS:  Anemia with a nondiagnostic GI workup last year.  Consent was signed after risks, benefits, methods, options were thoroughly discussed in the office in the past.  MEDICINES USED:  Demerol 80, Versed 8.  Recurrent anemia questionably due to donation of blood.  DESCRIPTION OF PROCEDURE:  The video endoscope was inserted by direct vision. The proximal and mid esophagus was normal and the distal esophagus a small hiatal hernia was seen.  He may have had an extremely short segment of Barretts with one leaf of the hiatal hernia slightly above the top of the hiatal hernia and scattered biopsies of this area were obtained to rule out any metaplasia.  The scope passed into the stomach and possibly some minimal antritis and atrophic gastritis was seen.  Advanced through a widely patent pyloris into a normal duodenal bulb and around the C-loop to a normal second and probably third part of the duodenum.  Distal duodenal biopsies were obtained to rule out any malabsorption.  The scope was slowly withdrawn back to the bulb.  A good look there ruled out ulcer in that location.  Stomach was evaluated on retroflex and straight visualization without additional findings mentioned above.  Hiatal hernia was confirmed in the cardia.  Fundus and angularis were normal.  The lesser and greater curve were pertinent for some mild inflammation versus atrophic gastritis.  Air was suctioned.  The scope was slowly withdrawn.  The biopsies of the distal esophagus GE junction were obtained at this point.  No other esophageal  problems were seen as we slowly withdrew back and removed the scope.  The patient tolerated the procedure well. There was no obvious immediate complications.  ENDOSCOPIC DIAGNOSES: 1. Small hiatal hernia, doubt Barretts, but possibly a very short segment,    status post biopsy. 2. Questionably very minimal antritis or atrophic gastritis. 3. Widely patent pyloris. 4. Otherwise within normal limits to the second or third part of the duodenum    status post biopsy.  PLAN:  Await pathology.  Go ahead and recheck a CBC in six weeks, without him donating between now and then and before repeating colon workup, probably repeat guaiacs just to confirm they were negative and told that they were negative recently done at Dr. Buel Ream office.  He will call me sooner p.r.n. Dictated by:   Petra Kuba, M.D. Attending Physician:  Nelda Marseille DD:  07/11/01 TD:  07/11/01 Job: [id number]

## 2010-08-06 NOTE — Consult Note (Signed)
[facility]ALTHCARE                          ENDOCRINOLOGY CONSULTATION   Kinan, Safley SHEAN GERDING                     MRN:          [id number]  DATE:07/11/2006                            DOB:          [birth year]    REASON FOR VISIT:  Follow up diabetes.   HISTORY OF THE PRESENT ILLNESS:  1. [age]-year-old man who states his glucose has ranged from 86-      153.  He takes Actos 45 mg a day and glipizide 10 mg a day.  2. Regarding his elevated triglycerides, he is working with his diet      and still takes Lovaza as prescribed.   He was also noted to have elevated hepatic transaminases at the time of  diagnosis of his diabetes.   PAST MEDICAL HISTORY:  1. GERD.  2. Hypertension.   REVIEW OF SYSTEMS:  He has gained several pounds since his last visit.  He denies hypoglycemia.   PHYSICAL EXAMINATION:  Blood pressure 134/80.  Heart rate is 65.  Temperature is 97.6.  The weight is 176.  GENERAL:  In no distress.  EXTREMITIES:  No edema.   LABORATORY STUDIES:  On July 11, 2006, total cholesterol 169,  triglycerides 72, HDL 43, LDL 112.   IMPRESSION:  1. Improved control of diabetes.  2. Resolution of elevated triglycerides.  3. Significant improvement in his nonalcoholic steatohepatitis.   PLAN:  1. Same Actos.  2. Decrease glipizide to 5 mg a day.  3. I told the patient he can go off his Lovaza.  4. Pravachol 40 mg a day.  5. Return in 3 months fasting.  I anticipate at that time I will be      able to drop back and see him much less often, if I need to at all.     Sean A. Everardo All, MD  Electronically Signed    SAE/MedQ  DD: 07/12/2006  DT: 07/12/2006  Job #: [id number]   cc:   [place], [place] Dr. Ashley Royalty

## 2010-08-06 NOTE — Op Note (Signed)
NAME:  SELIG, WAMPOLE NO.:  [id number]   MEDICAL RECORD NO.:  [id number]                   PATIENT TYPE:  AMB   LOCATION:  [facility]                                 FACILITY:  [facility]   PHYSICIAN:  Petra Kuba, M.D.                 DATE OF BIRTH:  1932-04-28   DATE OF PROCEDURE:  10/31/2003  DATE OF DISCHARGE:  10/31/2003                                 OPERATIVE REPORT   PROCEDURE:  Colonoscopy with polypectomy.   INDICATIONS:  Patient with weight loss, resolved iron deficiency, resolved  diarrhea and abdominal pain.  Want to rule out any significant colon  problem.  Almost due for colonic screening.  Consent was signed after risks,  benefits, methods, and options thoroughly discussed in the office.   MEDICINES USED:  Demerol 60 mg, Versed 6 mg.   PROCEDURE:  Rectal inspection was pertinent for small external hemorrhoids.  Digital exam was negative.  The video pediatric adjustable colonoscope was  inserted and with abdominal pressure easily able to be advanced around the  colon to the cecum.  No position changes were needed.  No abnormalities,  signs of bleeding, etc., were seen on insertion.  The cecum was identified  by the appendiceal orifice and the ileocecal valve.  In fact, the scope was  inserted a short way into the terminal ileum, which was normal.  Photo  documentation was obtained.  The scope was slowly withdrawn.  Prep on the  left side was better than the right.  There was some liquid stool that  required washing and suctioning and some stool adherent to the wall which  could not be washed off but on slow withdrawal through the colon, overall  adequate visualization was obtained.  Other than a tiny midsigmoid possible  polyp which was hot biopsied x1, no abnormalities, diverticula, etc., were  seen as we slowly withdrew back to the rectum.  Anorectal pull-through and  retroflexion confirmed some small hemorrhoids.  The scope was  straightened  and advanced a short way up the left side of the colon, air was suctioned,  and the scope removed.  The patient tolerated the procedure well.  There was  no obvious immediate complication.   ENDOSCOPIC DIAGNOSES:  1. Internal-external small hemorrhoids.  2. Sigmoid tiny questionable polyp, hot biopsied.  3. Otherwise within normal limits to the terminal ileum.   PLAN:  Await pathology, but probably recheck colon screening in five years.  Happy to see back p.r.n. or in two to three months to recheck symptoms and  make sure no further workup plans are needed.                                               Petra Kuba, M.D.   MEM/MEDQ  D:  10/31/2003  T:  11/02/2003  Job:  [id number]   cc:   Ernestina Penna, M.D.  [address] [place]  [place] [address]  Fax: [phone]

## 2010-08-06 NOTE — Letter (Signed)
Aug 05, 2009    Vida Rigger, MD  [phone]  [address]  [place], [place]  [address]   RE:  SHARIFF, LASKY  MRN:  [id number]  /  DOB:  Jul 27, 1932   Dear Vernia Buff,   Kandis Cocking (Date of birth October 17, 1932) is a patient I follow  in Cardiology Clinic.  I saw him recently for a return visit.  He had  said he had seen you in clinic and you had recommended discontinuing his  aspirin.  I am concerned given his history of coronary artery disease  and remote bypass in the mid 90s.  Review of his blood work in April,  his platelets are 110,000.  Recent upper endoscopy showed some gastritis  but no evidence for varices.  Is it okay to resume an enteric-coated  aspirin from your regard?   I look forward to hearing from you.    Sincerely,      Pricilla Riffle, MD, [facility]  Electronically Signed    PVR/MedQ  DD: 08/05/2009  DT: 08/06/2009  Job #: [id number]

## 2010-08-17 ENCOUNTER — Encounter (HOSPITAL_BASED_OUTPATIENT_CLINIC_OR_DEPARTMENT_OTHER): Payer: Medicare Other | Admitting: Internal Medicine

## 2010-08-17 ENCOUNTER — Other Ambulatory Visit: Payer: Self-pay | Admitting: Internal Medicine

## 2010-08-17 DIAGNOSIS — D696 Thrombocytopenia, unspecified: Secondary | ICD-10-CM

## 2010-08-17 DIAGNOSIS — D61818 Other pancytopenia: Secondary | ICD-10-CM

## 2010-08-17 LAB — CBC WITH DIFFERENTIAL/PLATELET
Basophils Absolute: 0 10*3/uL (ref 0.0–0.1)
Eosinophils Absolute: 0.1 10*3/uL (ref 0.0–0.5)
HGB: 11.8 g/dL — ABNORMAL LOW (ref 13.0–17.1)
MONO#: 0.2 10*3/uL (ref 0.1–0.9)
NEUT#: 2 10*3/uL (ref 1.5–6.5)
Platelets: 62 10*3/uL — ABNORMAL LOW (ref 140–400)
RBC: 3.87 10*6/uL — ABNORMAL LOW (ref 4.20–5.82)
RDW: 14.7 % — ABNORMAL HIGH (ref 11.0–14.6)
WBC: 2.8 10*3/uL — ABNORMAL LOW (ref 4.0–10.3)

## 2010-08-17 LAB — COMPREHENSIVE METABOLIC PANEL
Albumin: 4.1 g/dL (ref 3.5–5.2)
CO2: 22 mEq/L (ref 19–32)
Calcium: 9.5 mg/dL (ref 8.4–10.5)
Glucose, Bld: 179 mg/dL — ABNORMAL HIGH (ref 70–99)
Potassium: 3.7 mEq/L (ref 3.5–5.3)
Sodium: 141 mEq/L (ref 135–145)
Total Protein: 7.1 g/dL (ref 6.0–8.3)

## 2010-08-23 ENCOUNTER — Other Ambulatory Visit: Payer: Self-pay | Admitting: *Deleted

## 2010-08-23 MED ORDER — OMEPRAZOLE MAGNESIUM 20 MG PO TBEC: 20.0000 mg | DELAYED_RELEASE_TABLET | Freq: Every day | ORAL | Status: DC

## 2010-08-23 MED ORDER — PRAVASTATIN SODIUM 80 MG PO TABS: ORAL_TABLET | ORAL | Status: DC

## 2010-08-23 NOTE — Telephone Encounter (Signed)
R'cd fax from [facility] in [place] for refill of pt's Pravachol and Omeprazole.  Last OV-06/17/2010  Last filled-Pravastatin-06/26/2010  Omeprazole-07/22/2010

## 2010-11-08 ENCOUNTER — Other Ambulatory Visit: Payer: Self-pay | Admitting: *Deleted

## 2010-11-08 MED ORDER — AMLODIPINE BESYLATE 2.5 MG PO TABS: 2.5000 mg | ORAL_TABLET | Freq: Every day | ORAL | Status: DC

## 2010-11-08 NOTE — Telephone Encounter (Signed)
R'cd fax from [facility] for refill of Amlodipine  Last OV-06/17/2010  Last filled-10/08/2010

## 2010-12-16 ENCOUNTER — Other Ambulatory Visit: Payer: Self-pay | Admitting: Internal Medicine

## 2010-12-16 ENCOUNTER — Encounter (HOSPITAL_BASED_OUTPATIENT_CLINIC_OR_DEPARTMENT_OTHER): Payer: Medicare Other | Admitting: Internal Medicine

## 2010-12-16 ENCOUNTER — Encounter: Payer: MEDICARE | Admitting: Endocrinology

## 2010-12-16 DIAGNOSIS — D696 Thrombocytopenia, unspecified: Secondary | ICD-10-CM

## 2010-12-16 DIAGNOSIS — D61818 Other pancytopenia: Secondary | ICD-10-CM

## 2010-12-16 LAB — CBC WITH DIFFERENTIAL/PLATELET
Basophils Absolute: 0 10*3/uL (ref 0.0–0.1)
Eosinophils Absolute: 0.1 10*3/uL (ref 0.0–0.5)
HCT: 34.4 % — ABNORMAL LOW (ref 38.4–49.9)
LYMPH%: 18.5 % (ref 14.0–49.0)
MCV: 83.5 fL (ref 79.3–98.0)
MONO#: 0.3 10*3/uL (ref 0.1–0.9)
MONO%: 8.3 % (ref 0.0–14.0)
NEUT#: 2.1 10*3/uL (ref 1.5–6.5)
NEUT%: 69.8 % (ref 39.0–75.0)
Platelets: 67 10*3/uL — ABNORMAL LOW (ref 140–400)
RBC: 4.12 10*6/uL — ABNORMAL LOW (ref 4.20–5.82)

## 2010-12-20 ENCOUNTER — Other Ambulatory Visit (INDEPENDENT_AMBULATORY_CARE_PROVIDER_SITE_OTHER): Payer: Medicare Other

## 2010-12-20 ENCOUNTER — Encounter: Payer: Self-pay | Admitting: Endocrinology

## 2010-12-20 ENCOUNTER — Ambulatory Visit (INDEPENDENT_AMBULATORY_CARE_PROVIDER_SITE_OTHER)
Admission: RE | Admit: 2010-12-20 | Discharge: 2010-12-20 | Disposition: A | Discharge status: Home / Self Care | Payer: Medicare Other | Source: Ambulatory Visit | Attending: Endocrinology | Admitting: Endocrinology

## 2010-12-20 ENCOUNTER — Ambulatory Visit (INDEPENDENT_AMBULATORY_CARE_PROVIDER_SITE_OTHER): Payer: Medicare Other | Admitting: Endocrinology

## 2010-12-20 VITALS — BP 104/68 | HR 76 | Temp 98.6°F | Ht 67.0 in | Wt 176.8 lb

## 2010-12-20 DIAGNOSIS — M545 Low back pain, unspecified: Secondary | ICD-10-CM | POA: Insufficient documentation

## 2010-12-20 DIAGNOSIS — E119 Type 2 diabetes mellitus without complications: Secondary | ICD-10-CM

## 2010-12-20 DIAGNOSIS — R059 Cough, unspecified: Secondary | ICD-10-CM

## 2010-12-20 DIAGNOSIS — R05 Cough: Secondary | ICD-10-CM

## 2010-12-20 DIAGNOSIS — R229 Localized swelling, mass and lump, unspecified: Secondary | ICD-10-CM

## 2010-12-20 DIAGNOSIS — Z23 Encounter for immunization: Secondary | ICD-10-CM

## 2010-12-20 DIAGNOSIS — D485 Neoplasm of uncertain behavior of skin: Secondary | ICD-10-CM

## 2010-12-20 LAB — URINALYSIS, ROUTINE W REFLEX MICROSCOPIC
Bilirubin Urine: NEGATIVE
Ketones, ur: NEGATIVE
Leukocytes, UA: NEGATIVE
Nitrite: NEGATIVE
Specific Gravity, Urine: 1.02 (ref 1.000–1.030)
pH: 6 (ref 5.0–8.0)

## 2010-12-20 LAB — MICROALBUMIN / CREATININE URINE RATIO
Creatinine,U: 289.2 mg/dL
Microalb, Ur: 5 mg/dL — ABNORMAL HIGH (ref 0.0–1.9)

## 2010-12-20 LAB — SEDIMENTATION RATE: Sed Rate: 32 mm/hr — ABNORMAL HIGH (ref 0–22)

## 2010-12-20 NOTE — Progress Notes (Signed)
Subjective:    Patient ID: Matthew Singh, male    DOB: 1933/02/14, 77 y.o.   MRN: [id number]  HPI here for regular wellness examination.  He's feeling pretty well in general, and says chronic med probs are stable, except as noted below Past Medical History  Diagnosis Date  . NASH (nonalcoholic steatohepatitis)   . DIABETES MELLITUS, TYPE II 11/02/2006  . Pure hypercholesterolemia 10/08/2007  . HYPERLIPIDEMIA-MIXED 07/02/2009  . GOUT 06/14/2007  . THROMBOCYTOPENIA 12/17/2008  . HYPERTENSION 10/08/2007  . CORONARY ARTERY DISEASE 11/02/2006  . CORONARY ATHEROSCLEROSIS NATIVE CORONARY ARTERY 11/17/2009  . AORTIC STENOSIS 07/02/2009  . GERD 11/02/2006  . FATTY LIVER DISEASE 10/08/2007  . HYPERTROPHY PROSTATE W/UR OBST & OTH LUTS 02/21/2009    Past Surgical History  Procedure Date  . Coronary artery bypass graft   . Stress cardiolite 08/11/2006  . Transthoracic echocardiogram 08/11/2006    History   Social History  . Marital Status: Married    Spouse Name: N/A    Number of Children: N/A  . Years of Education: N/A   Occupational History  .      retired   Social History Main Topics  . Smoking status: Never Smoker   . Smokeless tobacco: Not on file  . Alcohol Use:   . Drug Use:   . Sexually Active:    Other Topics Concern  . Not on file   Social History Narrative  . No narrative on file    Current Outpatient Prescriptions on File Prior to Visit  Medication Sig Dispense Refill  . amLODipine (NORVASC) 2.5 MG tablet Take 1 tablet (2.5 mg total) by mouth daily.  30 tablet  4  . finasteride (PROSCAR) 5 MG tablet Take 5 mg by mouth daily.        . folic acid (FOLVITE) 1 MG tablet Take 1 mg by mouth daily.        Marland Kitchen glucose blood (ONE TOUCH ULTRA TEST) test strip Use as instructed. Check bs four times a day       . metFORMIN (GLUCOPHAGE-XR) 500 MG 24 hr tablet Take by mouth. 4 tablets once daily       . nitroGLYCERIN (NITROSTAT) 0.4 MG SL tablet Place 0.4 mg under the tongue every  5 (five) minutes as needed. At onset of chest pain for up to 3 doses       . olmesartan (BENICAR) 40 MG tablet Take 40 mg by mouth daily.        Marland Kitchen omeprazole (PRILOSEC OTC) 20 MG tablet Take 1 tablet (20 mg total) by mouth daily.  30 tablet  4  . ONE TOUCH LANCETS MISC Use four times a day       . pravastatin (PRAVACHOL) 80 MG tablet 1/2 tablet at bedtime  15 tablet  4  . sitaGLIPtan (JANUVIA) 100 MG tablet Take 1 tablet (100 mg total) by mouth daily.  30 tablet  11  . Tamsulosin HCl (FLOMAX) 0.4 MG CAPS 1 capsule by mouth once daily         Allergies  Allergen Reactions  . Azor (Amlodipine-Olmesartan) Rash    Family History  Problem Relation Age of Onset  . Cancer Mother     pancreatic cancer  . Cancer Sister     colon cancer (advanced age)    BP 104/68  Pulse 76  Temp(Src) 98.6 F (37 C) (Oral)  Ht 5\' 7"  (1.702 m)  Wt 176 lb 12.8 oz (80.196 kg)  BMI 27.69 kg/m2  SpO2 97%     Review of Systems  Constitutional: Negative for fever.  HENT: Negative for hearing loss.   Eyes: Negative for visual disturbance.  Respiratory: Negative for shortness of breath.   Cardiovascular: Negative for chest pain.  Gastrointestinal: Negative for anal bleeding.  Genitourinary: Negative for hematuria.  Musculoskeletal: Negative for gait problem.  Skin: Negative for rash.  Neurological: Negative for syncope.  Hematological: Does not bruise/bleed easily.  Psychiatric/Behavioral: The patient is not nervous/anxious.        Objective:   Physical Exam VS: see vs page GEN: no distress HEAD: head: no deformity eyes: no periorbital swelling, no proptosis external nose and ears are normal mouth: no lesion seen NECK: supple, thyroid is not enlarged CHEST WALL: no deformity.  There is an old median sternotomy scar. LUNGS: clear to auscultation. BREASTS:  No gynecomastia. CV: reg rate and rhythm.  There is a coarse systolic murmur ABD: abdomen is soft, nontender.  no hepatosplenomegaly.   not distended.  There is a self-reducing midline ventral hernia GENITALIA:  sees urology RECTAL: sees urology PROSTATE:  sees urology MUSCULOSKELETAL: muscle bulk and strength are grossly normal.  no obvious joint swelling.  gait is normal and steady.  There is a healed surgical scar on the right shoulder. EXTEMITIES: no deformity.  no ulcer on the feet.  feet are of normal color and temp.  no edema.  There is a healed vein harvest scar at the right leg.  There is bilateral onychomycosis. PULSES: dorsalis pedis intact bilat.  There are bilat carotid bruits, vs transmitted murmur. NEURO:  cn 2-12 grossly intact.   readily moves all 4's.  SKIN:  Normal texture and temperature.  There are a few ecchymoses on the forearms.  Multiple seborrheic keratoses.  Skin: at the left ant chest, there is a 1.5 cm irregularly pigmented nodule. NODES:  None palpable at the neck. PSYCH: alert, oriented x3.  Does not appear anxious nor depressed.       Assessment & Plan:  Wellness visit today, with problems stable, except as noted.     SEPARATE EVALUATION FOLLOWS--EACH PROBLEM HERE IS NEW, NOT RESPONDING TO TREATMENT, OR POSES SIGNIFICANT RISK TO THE PATIENT'S HEALTH: HISTORY OF THE PRESENT ILLNESS: Pt states a few weeks of recurrence of moderate pain at the lower back, in the context of yard work.  No assoc numbness. He has a chronic prod cough. PAST MEDICAL HISTORY reviewed and up to date today REVIEW OF SYSTEMS: Denies decreased urinary stream, and weight change.  PHYSICAL EXAMINATION: VITAL SIGNS:  See vs page GENERAL: no distress LUNGS:  Clear to auscultation, except for rales at the right base Back: healed midline lumbar scar.  nontender Neuro: sensation is intact to touch on the feet LAB/XRAY RESULTS: Lab Results  Component Value Date   HGBA1C 7.6* 12/20/2010  IMPRESSION: DM: he needs increased rx, if it can be done with a regimen that avoids or minimizes hypoglycemia. Low-back pain.  This  limits exercise rx of dm.   Abnormal breath sounds, new.  He also has a slight cough. PLAN: See instruction page.   procedure: shave bx of nodule at the anterior chest pt agrees to procedure, and consent is on chart prep is betadine local is 2% xylocaine with epi the lesion is shaved to skin level with a "dermablade" base is cauterized with a electric cauterizer bandaid applied no complications

## 2010-12-20 NOTE — Patient Instructions (Addendum)
blood tests, and a chest-x-ray, are being requested for you today.  please call [phone] to hear your test results.  You will be prompted to enter the 9-digit "MRN" number that appears at the top left of this page, followed by #.  Then you will hear the message. please consider these measures for your health:  minimize alcohol.  do not use tobacco products.  have a colonoscopy at least every 10 years from age 75.  keep firearms safely stored.  always use seat belts.  have working smoke alarms in your home.  see an eye doctor and dentist regularly.  never drive under the influence of alcohol or drugs (including prescription drugs).  those with fair skin should take precautions against the sun. please let me know what your wishes would be, if artificial life support measures should become necessary.  it is critically important to prevent falling down (keep floor areas well-lit, dry, and free of loose objects.  If you have a cane, walker, or wheelchair, you should use it, even for short trips around the house.  Also, try not to rush) You should have a vaccine against shingles (a painful rash which results from the  chickenpox infection which most people had many years ago).  This vaccine reduces, but does not totally eliminate the risk of shingles.  Because this is a medicare part d benefit, there are 3 ways you can get it:  You can go to a pharmacy and get the injection (I can give you a prescription), or I can give you a prescription to have filled at a pharmacy, and bring back here for [place] to give.  The other option is that you can pay up-front for it, and we'll give you a form to make a claim for reimbursement from your medicare part d carrier. Please come back for a follow-up appointment in 6 months. (update: i left message on phone-tree:  i advised add actos, or change januvia to victoza).

## 2010-12-30 DIAGNOSIS — D485 Neoplasm of uncertain behavior of skin: Secondary | ICD-10-CM | POA: Insufficient documentation

## 2011-01-31 ENCOUNTER — Other Ambulatory Visit: Payer: Self-pay | Admitting: *Deleted

## 2011-01-31 MED ORDER — OMEPRAZOLE MAGNESIUM 20 MG PO TBEC: 20.0000 mg | DELAYED_RELEASE_TABLET | Freq: Every day | ORAL | Status: DC

## 2011-01-31 NOTE — Telephone Encounter (Signed)
R'cd fax from [facility] for refill of Omeprazole  Last OV-12/20/2010  Last filled-12/31/2010

## 2011-02-16 ENCOUNTER — Other Ambulatory Visit: Payer: Self-pay | Admitting: *Deleted

## 2011-02-16 MED ORDER — FOLIC ACID 1 MG PO TABS: 1.0000 mg | ORAL_TABLET | Freq: Every day | ORAL | Status: DC

## 2011-02-16 NOTE — Telephone Encounter (Signed)
R'cd fax from [facility] for refill of Folic Acid  Last OV-12/20/2010  Last filled-01/15/2011

## 2011-02-21 ENCOUNTER — Ambulatory Visit: Payer: Medicare Other | Admitting: Internal Medicine

## 2011-03-01 ENCOUNTER — Encounter: Payer: Self-pay | Admitting: Internal Medicine

## 2011-03-14 ENCOUNTER — Telehealth: Payer: Self-pay | Admitting: *Deleted

## 2011-03-14 NOTE — Telephone Encounter (Signed)
Called pharmacy message left that the EMR record supports the once a day dose. Dr. Everardo All will need to confirm the correct daily dose.

## 2011-03-16 NOTE — Telephone Encounter (Signed)
The max dosage of januvia is 100 mg qd.  Please take this dosage.

## 2011-03-16 NOTE — Telephone Encounter (Signed)
Called patients pharmacy and confirmed the dose of 100 mg daily as per Dr. Everardo All. Also confirmed with the pharmacy that the patient has refills available ... It is just too early to refill per his insurance. Spoke with Mr Forti and confirmed the daily dose of Januvia 100 mg (1 tablet) with Januvia 100 mg samples #21 and Benicar 40 mg #21 provided. He will pick them up tomorrow.

## 2011-03-23 ENCOUNTER — Telehealth: Payer: Self-pay | Admitting: Internal Medicine

## 2011-03-23 NOTE — Telephone Encounter (Signed)
l/m with appt info and mailed,appper pof   aom

## 2011-04-08 ENCOUNTER — Encounter: Payer: Self-pay | Admitting: Internal Medicine

## 2011-04-08 ENCOUNTER — Ambulatory Visit (INDEPENDENT_AMBULATORY_CARE_PROVIDER_SITE_OTHER): Payer: Medicare Other | Admitting: Internal Medicine

## 2011-04-08 DIAGNOSIS — E7800 Pure hypercholesterolemia, unspecified: Secondary | ICD-10-CM

## 2011-04-08 DIAGNOSIS — D696 Thrombocytopenia, unspecified: Secondary | ICD-10-CM

## 2011-04-08 DIAGNOSIS — I2510 Atherosclerotic heart disease of native coronary artery without angina pectoris: Secondary | ICD-10-CM

## 2011-04-08 DIAGNOSIS — I10 Essential (primary) hypertension: Secondary | ICD-10-CM

## 2011-04-08 DIAGNOSIS — I359 Nonrheumatic aortic valve disorder, unspecified: Secondary | ICD-10-CM

## 2011-04-08 DIAGNOSIS — I119 Hypertensive heart disease without heart failure: Secondary | ICD-10-CM

## 2011-04-08 NOTE — Assessment & Plan Note (Signed)
Followed by Ebbie Latus

## 2011-04-08 NOTE — Assessment & Plan Note (Signed)
Patient is now about 17 years since CABG.   I have asked him to have a lexiscan myoview to evaluate for ischemia as he may be developing graft problems.  Esp important with poss surgery.

## 2011-04-08 NOTE — Progress Notes (Addendum)
HPI Patient is a 78 year old with a history of CAD (sp CABG in 1996), DM, dyslipidemia ,HTN, mild AS and gout.  I saw him in January 2012.  Myovewio ion June 2010 was low risk with only a question of basal inferior ischemia.  Normal perfusion elsewhere. SInce seen 2 wks ago had spell of vomitting.  Dr. Derrell Lolling said he was not a surgical candidate as liver enlarge Allergies  Allergen Reactions  . Azor (Amlodipine-Olmesartan) Rash    Current Outpatient Prescriptions  Medication Sig Dispense Refill  . allopurinol (ZYLOPRIM) 100 MG tablet Take 100 mg by mouth daily.        Marland Kitchen amLODipine (NORVASC) 2.5 MG tablet Take 1 tablet (2.5 mg total) by mouth daily.  30 tablet  4  . finasteride (PROSCAR) 5 MG tablet Take 5 mg by mouth daily.        . folic acid (FOLVITE) 1 MG tablet Take 1 tablet (1 mg total) by mouth daily.  30 tablet  11  . glucose blood (ONE TOUCH ULTRA TEST) test strip Use as instructed. Check bs four times a day       . nitroGLYCERIN (NITROSTAT) 0.4 MG SL tablet Place 0.4 mg under the tongue every 5 (five) minutes as needed. At onset of chest pain for up to 3 doses       . olmesartan (BENICAR) 40 MG tablet Take 40 mg by mouth daily.        Marland Kitchen omeprazole (PRILOSEC OTC) 20 MG tablet Take 1 tablet (20 mg total) by mouth daily.  30 tablet  10  . ONE TOUCH LANCETS MISC Use four times a day       . pravastatin (PRAVACHOL) 80 MG tablet 1/2 tablet at bedtime  15 tablet  4  . sitaGLIPtan (JANUVIA) 100 MG tablet Take 1 tablet (100 mg total) by mouth daily.  30 tablet  11  . Tamsulosin HCl (FLOMAX) 0.4 MG CAPS 1 capsule by mouth once daily         Past Medical History  Diagnosis Date  . NASH (nonalcoholic steatohepatitis)   . DIABETES MELLITUS, TYPE II 11/02/2006  . Pure hypercholesterolemia 10/08/2007  . HYPERLIPIDEMIA-MIXED 07/02/2009  . GOUT 06/14/2007  . THROMBOCYTOPENIA 12/17/2008  . HYPERTENSION 10/08/2007  . CORONARY ARTERY DISEASE 11/02/2006  . CORONARY ATHEROSCLEROSIS NATIVE CORONARY  ARTERY 11/17/2009  . AORTIC STENOSIS 07/02/2009  . GERD 11/02/2006  . FATTY LIVER DISEASE 10/08/2007  . HYPERTROPHY PROSTATE W/UR OBST & OTH LUTS 02/21/2009    Past Surgical History  Procedure Date  . Coronary artery bypass graft   . Stress cardiolite 08/11/2006  . Transthoracic echocardiogram 08/11/2006    Family History  Problem Relation Age of Onset  . Cancer Mother     pancreatic cancer  . Cancer Sister     colon cancer (advanced age)    History   Social History  . Marital Status: Married    Spouse Name: N/A    Number of Children: N/A  . Years of Education: N/A   Occupational History  .      retired   Social History Main Topics  . Smoking status: Never Smoker   . Smokeless tobacco: Not on file  . Alcohol Use:   . Drug Use:   . Sexually Active:    Other Topics Concern  . Not on file   Social History Narrative  . No narrative on file    Review of Systems:  All systems reviewed.  They are  negative to the above problem except as previously stated.  Vital Signs: BP 127/71  Pulse 73  Ht 5\' 7"  (1.702 m)  Wt 175 lb (79.379 kg)  BMI 27.41 kg/m2  Physical Exam  HEENT:  Normocephalic, atraumatic. EOMI, PERRLA.  Neck: JVP is normal. No thyromegaly. No bruits.  Lungs: clear to auscultation. No rales no wheezes.  Heart: Regular rate and rhythm. Normal S1, S2. No S3.   III?VI systolic murmur at base to apex. PMI not displaced.  Abdomen:  Supple, nontender. Normal bowel sounds. No masses. No hepatomegaly.  Extremities:   Good distal pulses throughout. No lower extremity edema.  Musculoskeletal :moving all extremities.  Neuro:   alert and oriented x3.  CN II-XII grossly intact.  EKG:  Sinus rhythm.  66 bpm.  First degree  AV block  LAFB. LVH.   Assessment and Plan:

## 2011-04-08 NOTE — Assessment & Plan Note (Signed)
BP is adequately controlled. 

## 2011-04-08 NOTE — Assessment & Plan Note (Signed)
Will set up for fasting panel

## 2011-04-08 NOTE — Assessment & Plan Note (Signed)
AS was mild by echo.  Exam sugg there has been no signif change.

## 2011-04-08 NOTE — Patient Instructions (Signed)
Your physician recommends that you schedule a follow-up appointment in: 1 year Your physician recommends that you return for lab work with your Lexiscan myoview (Lipid and Liver profile) Your physician has requested that you have a lexiscan myoview. For further information please visit [email or web address] Please follow instruction sheet, as given.

## 2011-04-18 ENCOUNTER — Telehealth: Payer: Self-pay | Admitting: Endocrinology

## 2011-04-18 NOTE — Telephone Encounter (Signed)
Pt is requesting samples of Benicar 40mg --Pt wil pick up this Wednesday---

## 2011-04-18 NOTE — Telephone Encounter (Signed)
Left message informing pt samples are available and ready for pickup. Samples placed upfront in cabinet.

## 2011-04-19 ENCOUNTER — Other Ambulatory Visit: Payer: Self-pay | Admitting: *Deleted

## 2011-04-19 MED ORDER — AMLODIPINE BESYLATE 2.5 MG PO TABS: 2.5000 mg | ORAL_TABLET | Freq: Every day | ORAL | Status: DC

## 2011-04-19 MED ORDER — FOLIC ACID 1 MG PO TABS: 1.0000 mg | ORAL_TABLET | Freq: Every day | ORAL | Status: DC

## 2011-04-19 NOTE — Telephone Encounter (Signed)
R'cd fax from [facility] for refills of Folic Acid and Amlodipine  Last OV-12/20/2010

## 2011-04-20 ENCOUNTER — Telehealth: Payer: Self-pay | Admitting: Internal Medicine

## 2011-04-20 ENCOUNTER — Other Ambulatory Visit (HOSPITAL_BASED_OUTPATIENT_CLINIC_OR_DEPARTMENT_OTHER): Payer: Medicare Other | Admitting: Lab

## 2011-04-20 ENCOUNTER — Ambulatory Visit (HOSPITAL_BASED_OUTPATIENT_CLINIC_OR_DEPARTMENT_OTHER): Payer: Medicare Other | Admitting: Internal Medicine

## 2011-04-20 DIAGNOSIS — D61811 Other drug-induced pancytopenia: Secondary | ICD-10-CM

## 2011-04-20 DIAGNOSIS — D539 Nutritional anemia, unspecified: Secondary | ICD-10-CM

## 2011-04-20 LAB — CBC WITH DIFFERENTIAL/PLATELET
BASO%: 0.4 % (ref 0.0–2.0)
EOS%: 3.5 % (ref 0.0–7.0)
HCT: 33.5 % — ABNORMAL LOW (ref 38.4–49.9)
LYMPH%: 24.4 % (ref 14.0–49.0)
MCH: 28 pg (ref 27.2–33.4)
MCHC: 33.8 g/dL (ref 32.0–36.0)
MONO#: 0.2 10*3/uL (ref 0.1–0.9)
NEUT%: 63.8 % (ref 39.0–75.0)
Platelets: 61 10*3/uL — ABNORMAL LOW (ref 140–400)

## 2011-04-20 NOTE — Progress Notes (Signed)
[facility] OFFICE PROGRESS NOTE  Romero Belling, MD, MD [address] 4th Floor [place] [address]  PRINCIPAL DIAGNOSIS:  Drug-induced pancytopenia.  CURRENT THERAPY:  Observation.  INTERVAL HISTORY: Matthew Singh 78 y.o. male returns to the clinic today for  three-month followup visit accompanied his wife. He is feeling fine today with no specific complaints. He has no weight loss or night sweats, no bleeding issues, no palpable lymphadenopathy. Has no chest pain or shortness of breath. The patient has repeat  CBC today and he is here for evaluation and discussion of his lab results. He still on Pravachol for hyper lipidemia in addition to allopurinol for gout. He has a previous bone marrow biopsy and aspirate which showed no significant abnormalities.  MEDICAL HISTORY: Past Medical History  Diagnosis Date  . NASH (nonalcoholic steatohepatitis)   . DIABETES MELLITUS, TYPE II 11/02/2006  . Pure hypercholesterolemia 10/08/2007  . HYPERLIPIDEMIA-MIXED 07/02/2009  . GOUT 06/14/2007  . THROMBOCYTOPENIA 12/17/2008  . HYPERTENSION 10/08/2007  . CORONARY ARTERY DISEASE 11/02/2006  . CORONARY ATHEROSCLEROSIS NATIVE CORONARY ARTERY 11/17/2009  . AORTIC STENOSIS 07/02/2009  . GERD 11/02/2006  . FATTY LIVER DISEASE 10/08/2007  . HYPERTROPHY PROSTATE W/UR OBST & OTH LUTS 02/21/2009    ALLERGIES:  is allergic to azor.  MEDICATIONS:  Current Outpatient Prescriptions  Medication Sig Dispense Refill  . allopurinol (ZYLOPRIM) 100 MG tablet Take 100 mg by mouth as needed.       Marland Kitchen amLODipine (NORVASC) 2.5 MG tablet Take 1 tablet (2.5 mg total) by mouth daily.  30 tablet  10  . finasteride (PROSCAR) 5 MG tablet Take 5 mg by mouth daily.        . folic acid (FOLVITE) 1 MG tablet Take 1 tablet (1 mg total) by mouth daily.  30 tablet  10  . glucose blood (ONE TOUCH ULTRA TEST) test strip Use as instructed. Check bs four times a day       . metFORMIN (GLUCOPHAGE) 500 MG tablet  Take 500 mg by mouth daily with breakfast.      . nitroGLYCERIN (NITROSTAT) 0.4 MG SL tablet Place 0.4 mg under the tongue every 5 (five) minutes as needed. At onset of chest pain for up to 3 doses       . olmesartan (BENICAR) 40 MG tablet Take 40 mg by mouth daily.        Marland Kitchen omeprazole (PRILOSEC OTC) 20 MG tablet Take 1 tablet (20 mg total) by mouth daily.  30 tablet  10  . ONE TOUCH LANCETS MISC Use four times a day       . pravastatin (PRAVACHOL) 80 MG tablet 1/2 tablet at bedtime  15 tablet  4  . sitaGLIPtan (JANUVIA) 100 MG tablet Take 1 tablet (100 mg total) by mouth daily.  30 tablet  11  . Tamsulosin HCl (FLOMAX) 0.4 MG CAPS 1 capsule by mouth once daily         SURGICAL HISTORY:  Past Surgical History  Procedure Date  . Coronary artery bypass graft   . Stress cardiolite 08/11/2006  . Transthoracic echocardiogram 08/11/2006    REVIEW OF SYSTEMS:  A comprehensive review of systems was negative.   PHYSICAL EXAMINATION: General appearance: alert, cooperative and no distress Lymph nodes: Cervical, supraclavicular, and axillary nodes normal. Resp: clear to auscultation bilaterally Cardio: regular rate and rhythm, S1, S2 normal, no murmur, click, rub or gallop GI: soft, non-tender; bowel sounds normal; no  masses,  no organomegaly Extremities: extremities normal, atraumatic, no cyanosis or edema  ECOG PERFORMANCE STATUS: 0 - Asymptomatic  Blood pressure 134/67, pulse 66, temperature 97.4 F (36.3 C), temperature source Oral, height 5\' 7"  (1.702 m), weight 173 lb 4.8 oz (78.608 kg).  LABORATORY DATA: Lab Results  Component Value Date   WBC 2.9* 04/20/2011   HGB 11.3* 04/20/2011   HCT 33.5* 04/20/2011   MCV 82.8 04/20/2011   PLT 61* 04/20/2011      Chemistry      Component Value Date/Time   NA 141 08/17/2010 0834   K 3.7 08/17/2010 0834   CL 106 08/17/2010 0834   CO2 22 08/17/2010 0834   BUN 17 08/17/2010 0834   CREATININE 1.41 08/17/2010 0834      Component Value Date/Time    CALCIUM 9.5 08/17/2010 0834   ALKPHOS 152* 08/17/2010 0834   AST 23 08/17/2010 0834   ALT 8 08/17/2010 0834   BILITOT 1.2 08/17/2010 0834       RADIOGRAPHIC STUDIES: No results found.  ASSESSMENT: This is a very pleasant 78 years old white male with pancytopenia most likely drug induced. The patient is doing fine and currently asymptomatic.  PLAN: I recommended for him continuous observation for now with repeat CBC, comprehensive metabolic panel and LDH in 3 months. I ordered several other studies today to evaluate his pancytopenia including HIV, hepatitis panel, serum protein electrophoreses, serum folate, serum B12 level and iron study. ALK was invasion of there is any abnormalities in the lab work.  All questions were answered. The patient knows to call the clinic with any problems, questions or concerns. We can certainly see the patient much sooner if necessary.

## 2011-04-20 NOTE — Telephone Encounter (Signed)
appt made for 4/30 and printed  aom

## 2011-04-22 LAB — COMPREHENSIVE METABOLIC PANEL
ALT: 15 U/L (ref 0–53)
BUN: 17 mg/dL (ref 6–23)
CO2: 22 mEq/L (ref 19–32)
Creatinine, Ser: 1.32 mg/dL (ref 0.50–1.35)
Total Bilirubin: 1.1 mg/dL (ref 0.3–1.2)

## 2011-04-22 LAB — IRON AND TIBC
%SAT: 14 % — ABNORMAL LOW (ref 20–55)
TIBC: 392 ug/dL (ref 215–435)
UIBC: 337 ug/dL (ref 125–400)

## 2011-04-22 LAB — LACTATE DEHYDROGENASE: LDH: 147 U/L (ref 94–250)

## 2011-04-22 LAB — PROTEIN ELECTROPHORESIS, SERUM
Albumin ELP: 54.2 % — ABNORMAL LOW (ref 55.8–66.1)
Alpha-1-Globulin: 3.9 % (ref 2.9–4.9)
Alpha-2-Globulin: 8.9 % (ref 7.1–11.8)
Gamma Globulin: 17.3 % (ref 11.1–18.8)
Total Protein, Serum Electrophoresis: 7.2 g/dL (ref 6.0–8.3)

## 2011-04-22 LAB — ERYTHROPOIETIN: Erythropoietin: 181 m[IU]/mL — ABNORMAL HIGH (ref 2.6–34.0)

## 2011-04-22 LAB — FERRITIN: Ferritin: 11 ng/mL — ABNORMAL LOW (ref 22–322)

## 2011-04-25 ENCOUNTER — Telehealth: Payer: Self-pay | Admitting: Internal Medicine

## 2011-04-25 NOTE — Telephone Encounter (Signed)
Pt notified of prescription call in

## 2011-04-25 NOTE — Telephone Encounter (Signed)
Called to pharmacy and left message for pt to return my call.

## 2011-04-26 ENCOUNTER — Ambulatory Visit (HOSPITAL_COMMUNITY): Payer: Medicare Other | Attending: Cardiology | Admitting: Radiology

## 2011-04-26 ENCOUNTER — Other Ambulatory Visit (INDEPENDENT_AMBULATORY_CARE_PROVIDER_SITE_OTHER): Payer: Medicare Other | Admitting: *Deleted

## 2011-04-26 VITALS — Ht 67.0 in | Wt 170.0 lb

## 2011-04-26 DIAGNOSIS — Z8249 Family history of ischemic heart disease and other diseases of the circulatory system: Secondary | ICD-10-CM | POA: Insufficient documentation

## 2011-04-26 DIAGNOSIS — E7800 Pure hypercholesterolemia, unspecified: Secondary | ICD-10-CM

## 2011-04-26 DIAGNOSIS — E119 Type 2 diabetes mellitus without complications: Secondary | ICD-10-CM | POA: Insufficient documentation

## 2011-04-26 DIAGNOSIS — I2510 Atherosclerotic heart disease of native coronary artery without angina pectoris: Secondary | ICD-10-CM | POA: Insufficient documentation

## 2011-04-26 DIAGNOSIS — R5381 Other malaise: Secondary | ICD-10-CM | POA: Insufficient documentation

## 2011-04-26 DIAGNOSIS — I10 Essential (primary) hypertension: Secondary | ICD-10-CM | POA: Insufficient documentation

## 2011-04-26 DIAGNOSIS — E785 Hyperlipidemia, unspecified: Secondary | ICD-10-CM | POA: Insufficient documentation

## 2011-04-26 DIAGNOSIS — Z951 Presence of aortocoronary bypass graft: Secondary | ICD-10-CM | POA: Insufficient documentation

## 2011-04-26 DIAGNOSIS — Z87891 Personal history of nicotine dependence: Secondary | ICD-10-CM | POA: Insufficient documentation

## 2011-04-26 DIAGNOSIS — R112 Nausea with vomiting, unspecified: Secondary | ICD-10-CM | POA: Insufficient documentation

## 2011-04-26 LAB — HEPATIC FUNCTION PANEL
ALT: 15 U/L (ref 0–53)
Albumin: 4.2 g/dL (ref 3.5–5.2)
Total Protein: 7.6 g/dL (ref 6.0–8.3)

## 2011-04-26 LAB — LIPID PANEL
Cholesterol: 115 mg/dL (ref 0–200)
HDL: 38.2 mg/dL — ABNORMAL LOW (ref >39.00)
LDL Cholesterol: 63 mg/dL (ref 0–99)
Triglycerides: 68 mg/dL (ref 0.0–149.0)
VLDL: 13.6 mg/dL (ref 0.0–40.0)

## 2011-04-26 MED ORDER — TECHNETIUM TC 99M TETROFOSMIN IV KIT
33.0000 | PACK | Freq: Once | INTRAVENOUS | Status: AC | PRN
  Administered 2011-04-26: 33 via INTRAVENOUS

## 2011-04-26 MED ORDER — REGADENOSON 0.4 MG/5ML IV SOLN
0.4000 mg | Freq: Once | INTRAVENOUS | Status: AC
  Administered 2011-04-26: 0.4 mg via INTRAVENOUS

## 2011-04-26 MED ORDER — TECHNETIUM TC 99M TETROFOSMIN IV KIT
11.0000 | PACK | Freq: Once | INTRAVENOUS | Status: AC | PRN
  Administered 2011-04-26: 11 via INTRAVENOUS

## 2011-04-26 NOTE — Progress Notes (Signed)
[facility] SITE 3 NUCLEAR MED [address] [place] [address] [phone]  Cardiology Nuclear Med Study  Matthew Singh is a 78 y.o. male [id number] 1932-03-23   Nuclear Med Background Indication for Stress Test:  Evaluation for Ischemia and Graft Patency History:  '96 Heart Catheterization-> CABG x 4, 6/10 Myocardial Perfusion Study-Reversible inferior defect ? Mild ischemia, Low Risk, EF=60%,  '11 Echo: EF=55-60%, mild AS Cardiac Risk Factors: Family History - CAD, History of Smoking, Hypertension, Lipids and NIDDM  Symptoms:  Fatigue, Fatigue with Exertion, Nausea and Vomiting   Nuclear Pre-Procedure Caffeine/Decaff Intake:  None NPO After: 8:00pm 2 days ago 04/24/11  Lungs:  Clear IV 0.9% NS with Angio Cath:  20g  IV Site: R Antecubital x 1, tolerated well IV Started by:  Irean Hong, RN  Chest Size (in):  40 Cup Size: n/a  Height: 5\' 7"  (1.702 m)  Weight:  170 lb (77.111 kg)  BMI:  Body mass index is 26.63 kg/(m^2). Tech Comments:  Held po diabetic meds today, CBG (fasting) at 6:00am was 134 per patient    Nuclear Med Study 1 or 2 day study: 1 day  Stress Test Type:  Treadmill/Lexiscan  Reading MD: Willa Rough, MD  Order Authorizing Provider:  Dietrich Pates, MD  Resting Radionuclide: Technetium 47m Tetrofosmin  Resting Radionuclide Dose: 11.0 mCi   Stress Radionuclide:  Technetium 22m Tetrofosmin  Stress Radionuclide Dose: 33.0 mCi           Stress Protocol Rest HR: 66 Stress HR: 120  Rest BP: 108/58 Stress BP: 131/56  Exercise Time (min): 2:00 METS: n/a   Predicted Max HR: 144 bpm % Max HR: 83.33 bpm Rate Pressure Product: 21308   Dose of Adenosine (mg):  n/a Dose of Lexiscan: 0.4 mg  Dose of Atropine (mg): n/a Dose of Dobutamine: n/a mcg/kg/min (at max HR)  Stress Test Technologist: Irean Hong, RN  Nuclear Technologist:  Domenic Polite, CNMT     Rest Procedure:  Myocardial perfusion imaging was performed at rest 45 minutes  following the intravenous administration of Technetium 81m Tetrofosmin. Rest ECG: NSR with 1st AVB, sinus arrhythmia, Bigeminy PVC's, PAC, and > 1 second pause Stress Procedure:  The patient received IV Lexiscan 0.4 mg over 15-seconds with concurrent low level exercise. Technetium 56m Tetrofosmin was injected at 30-seconds while the patient continued walking.  There were no significant changes with Lexiscan. There were frequent V-ectopy with bigeminy and trigeminy PVC's, rare PAC, and occasional >1 second pauses. Quantitative spect images were obtained after a 45-minute delay. Stress ECG: No significant change from baseline ECG  QPS Raw Data Images:  Patient motion noted; appropriate software correction applied. Stress Images:  Very slight decrease activity at the base of the inferior wall in one image Rest Images:  Normal homogeneous uptake in all areas of the myocardium. Subtraction (SDS):  No evidence of ischemia. Transient Ischemic Dilatation (Normal <1.22):  1.04 Lung/Heart Ratio (Normal <0.45):  0.31  Quantitative Gated Spect Images QGS EDV:  96 ml QGS ESV:  35 ml QGS cine images:  Normal Wall Motion QGS EF: 64%  Impression Exercise Capacity:  Lexiscan with low level exercise. BP Response:  Normal blood pressure response. Clinical Symptoms:  Mild dizziness ECG Impression:  No significant ST segment change suggestive of ischemia. Comparison with Prior Nuclear Study: No significant change from previous study  2010  Overall Impression:  There is no significant abnormality noted. There is no significant change from the prior study. There  is very slight decrease activity at the base of the inferior wall in one image. There was a similar finding in 2010. Overall there is no significant  abnormality. There is no definite scar or ischemia.  Willa Rough, MD

## 2011-04-29 ENCOUNTER — Telehealth: Payer: Self-pay | Admitting: Internal Medicine

## 2011-04-29 ENCOUNTER — Other Ambulatory Visit: Payer: Self-pay | Admitting: Endocrinology

## 2011-04-29 MED ORDER — OLMESARTAN MEDOXOMIL 40 MG PO TABS: 40.0000 mg | ORAL_TABLET | Freq: Every day | ORAL | Status: DC

## 2011-04-29 NOTE — Telephone Encounter (Signed)
New Problem:      Patient called in claiming that you called him and wanted him to give you a call back. Please call back.

## 2011-04-29 NOTE — Telephone Encounter (Signed)
Rx sent to pharmacy, pt informed.  

## 2011-04-29 NOTE — Telephone Encounter (Signed)
Called with lab and nuc study results.

## 2011-04-29 NOTE — Telephone Encounter (Signed)
Pt requesting Benicar prescription sent to --[facility] in [place]

## 2011-05-09 ENCOUNTER — Telehealth: Payer: Self-pay | Admitting: Endocrinology

## 2011-05-09 NOTE — Telephone Encounter (Signed)
DUE TO FIXED INCOME, PT WANTS TO SWITCH TO A CHEAPER MEDICINE FOR DM.  HE TAKES JANUVIA 100 MG.

## 2011-05-10 NOTE — Telephone Encounter (Signed)
Please advise ov 

## 2011-05-10 NOTE — Telephone Encounter (Signed)
Can you call pt to schedule an OV, thanks.

## 2011-05-12 ENCOUNTER — Ambulatory Visit (INDEPENDENT_AMBULATORY_CARE_PROVIDER_SITE_OTHER): Payer: Medicare Other | Admitting: Endocrinology

## 2011-05-12 ENCOUNTER — Other Ambulatory Visit (INDEPENDENT_AMBULATORY_CARE_PROVIDER_SITE_OTHER): Payer: Medicare Other

## 2011-05-12 ENCOUNTER — Encounter: Payer: Self-pay | Admitting: Endocrinology

## 2011-05-12 VITALS — BP 140/78 | HR 60 | Temp 98.1°F | Ht 67.0 in | Wt 173.6 lb

## 2011-05-12 DIAGNOSIS — E119 Type 2 diabetes mellitus without complications: Secondary | ICD-10-CM

## 2011-05-12 MED ORDER — BROMOCRIPTINE MESYLATE 2.5 MG PO TABS: 2.5000 mg | ORAL_TABLET | Freq: Every day | ORAL | Status: DC

## 2011-05-12 NOTE — Telephone Encounter (Signed)
Appointment scheduled.

## 2011-05-12 NOTE — Progress Notes (Signed)
Subjective:    Patient ID: Matthew Singh, male    DOB: March 17, 1933, 78 y.o.   MRN: [id number]  HPI Pt returns for f/u of type 2 DM (2005).  pt states he feels well in general.  no cbg record, but states cbg's are well-controlled. Past Medical History  Diagnosis Date  . NASH (nonalcoholic steatohepatitis)   . DIABETES MELLITUS, TYPE II 11/02/2006  . Pure hypercholesterolemia 10/08/2007  . HYPERLIPIDEMIA-MIXED 07/02/2009  . GOUT 06/14/2007  . THROMBOCYTOPENIA 12/17/2008  . HYPERTENSION 10/08/2007  . CORONARY ARTERY DISEASE 11/02/2006  . CORONARY ATHEROSCLEROSIS NATIVE CORONARY ARTERY 11/17/2009  . AORTIC STENOSIS 07/02/2009  . GERD 11/02/2006  . FATTY LIVER DISEASE 10/08/2007  . HYPERTROPHY PROSTATE W/UR OBST & OTH LUTS 02/21/2009    Past Surgical History  Procedure Date  . Coronary artery bypass graft   . Stress cardiolite 08/11/2006  . Transthoracic echocardiogram 08/11/2006    History   Social History  . Marital Status: Married    Spouse Name: N/A    Number of Children: N/A  . Years of Education: N/A   Occupational History  .      retired   Social History Main Topics  . Smoking status: Never Smoker   . Smokeless tobacco: Not on file  . Alcohol Use:   . Drug Use:   . Sexually Active:    Other Topics Concern  . Not on file   Social History Narrative  . No narrative on file    Current Outpatient Prescriptions on File Prior to Visit  Medication Sig Dispense Refill  . allopurinol (ZYLOPRIM) 100 MG tablet Take 100 mg by mouth as needed.       Marland Kitchen amLODipine (NORVASC) 2.5 MG tablet Take 1 tablet (2.5 mg total) by mouth daily.  30 tablet  10  . finasteride (PROSCAR) 5 MG tablet Take 5 mg by mouth daily.        . folic acid (FOLVITE) 1 MG tablet Take 1 tablet (1 mg total) by mouth daily.  30 tablet  10  . glucose blood (ONE TOUCH ULTRA TEST) test strip Use as instructed. Check bs four times a day       . metFORMIN (GLUCOPHAGE) 500 MG tablet Take 500 mg by mouth daily with  breakfast.      . nitroGLYCERIN (NITROSTAT) 0.4 MG SL tablet Place 0.4 mg under the tongue every 5 (five) minutes as needed. At onset of chest pain for up to 3 doses       . olmesartan (BENICAR) 40 MG tablet Take 1 tablet (40 mg total) by mouth daily.  30 tablet  10  . omeprazole (PRILOSEC OTC) 20 MG tablet Take 1 tablet (20 mg total) by mouth daily.  30 tablet  10  . ONE TOUCH LANCETS MISC Use four times a day       . pravastatin (PRAVACHOL) 80 MG tablet 1/2 tablet at bedtime  15 tablet  4  . Tamsulosin HCl (FLOMAX) 0.4 MG CAPS 1 capsule by mouth once daily       . sitaGLIPtan (JANUVIA) 100 MG tablet Take 1 tablet (100 mg total) by mouth daily.  30 tablet  11    Allergies  Allergen Reactions  . Azor (Amlodipine-Olmesartan) Rash    Family History  Problem Relation Age of Onset  . Cancer Mother     pancreatic cancer  . Cancer Sister     colon cancer (advanced age)    BP 140/78  Pulse 60  Temp(Src)  98.1 F (36.7 C) (Oral)  Ht 5\' 7"  (1.702 m)  Wt 173 lb 9.6 oz (78.744 kg)  BMI 27.19 kg/m2  SpO2 96%     Review of Systems denies weight change    Objective:   Physical Exam VITAL SIGNS:  See vs page GENERAL: no distress Pulses: dorsalis pedis intact bilat.   Feet: no deformity.  no ulcer on the feet.  feet are of normal color and temp.  no edema.  There is bilateral onychomycosis There is a vein harvest site at the right leg.   Neuro: sensation is intact to touch on the feet.    Lab Results  Component Value Date   HGBA1C 7.1* 05/12/2011      Assessment & Plan:  DM, Needs increased rx, if it can be done with a regimen that avoids or minimizes hypoglycemia.

## 2011-05-12 NOTE — Patient Instructions (Addendum)
Here are some samples of "tradjenta," and "onglyza" (both interchangeable with [place]). blood tests are being requested for you today.  please call [phone] to hear your test results.  You will be prompted to enter the 9-digit "MRN" number that appears at the top left of this page, followed by #.  Then you will hear the message. Based on the results, i may advise you to add "bromocriptine" (generic). Please come back for a follow-up appointment in 3 months. (update: i left message on phone-tree:  i have sent a prescription to your pharmacy, for parlodel)

## 2011-06-20 ENCOUNTER — Ambulatory Visit: Payer: Medicare Other | Admitting: Endocrinology

## 2011-06-21 ENCOUNTER — Other Ambulatory Visit: Payer: Self-pay

## 2011-06-21 MED ORDER — PRAVASTATIN SODIUM 80 MG PO TABS: ORAL_TABLET | ORAL | Status: DC

## 2011-06-21 MED ORDER — FINASTERIDE 5 MG PO TABS: 5.0000 mg | ORAL_TABLET | Freq: Every day | ORAL | Status: DC

## 2011-06-27 ENCOUNTER — Ambulatory Visit: Payer: Medicare Other | Admitting: Endocrinology

## 2011-07-19 ENCOUNTER — Ambulatory Visit (HOSPITAL_BASED_OUTPATIENT_CLINIC_OR_DEPARTMENT_OTHER): Payer: Medicare Other | Admitting: Internal Medicine

## 2011-07-19 ENCOUNTER — Other Ambulatory Visit (HOSPITAL_BASED_OUTPATIENT_CLINIC_OR_DEPARTMENT_OTHER): Payer: Medicare Other | Admitting: Lab

## 2011-07-19 ENCOUNTER — Telehealth: Payer: Self-pay | Admitting: Internal Medicine

## 2011-07-19 VITALS — BP 134/69 | HR 65 | Temp 97.0°F | Ht 67.0 in | Wt 173.2 lb

## 2011-07-19 DIAGNOSIS — D61818 Other pancytopenia: Secondary | ICD-10-CM

## 2011-07-19 DIAGNOSIS — D696 Thrombocytopenia, unspecified: Secondary | ICD-10-CM

## 2011-07-19 DIAGNOSIS — D539 Nutritional anemia, unspecified: Secondary | ICD-10-CM

## 2011-07-19 LAB — CBC WITH DIFFERENTIAL/PLATELET
Eosinophils Absolute: 0.1 10*3/uL (ref 0.0–0.5)
HCT: 35.9 % — ABNORMAL LOW (ref 38.4–49.9)
LYMPH%: 19.6 % (ref 14.0–49.0)
MONO#: 0.3 10*3/uL (ref 0.1–0.9)
NEUT#: 2.2 10*3/uL (ref 1.5–6.5)
NEUT%: 67.2 % (ref 39.0–75.0)
Platelets: 50 10*3/uL — ABNORMAL LOW (ref 140–400)
WBC: 3.3 10*3/uL — ABNORMAL LOW (ref 4.0–10.3)

## 2011-07-19 LAB — COMPREHENSIVE METABOLIC PANEL
ALT: 12 U/L (ref 0–53)
BUN: 17 mg/dL (ref 6–23)
CO2: 24 mEq/L (ref 19–32)
Calcium: 9.2 mg/dL (ref 8.4–10.5)
Chloride: 105 mEq/L (ref 96–112)
Creatinine, Ser: 1.32 mg/dL (ref 0.50–1.35)

## 2011-07-19 LAB — LACTATE DEHYDROGENASE: LDH: 151 U/L (ref 94–250)

## 2011-07-19 NOTE — Telephone Encounter (Signed)
appts made and printed for pt aom °

## 2011-07-19 NOTE — Progress Notes (Signed)
[facility] [phone]   [phone]  OFFICE PROGRESS NOTE  Romero Belling, MD, MD [address] 4[address] [place] [address]  PRINCIPAL DIAGNOSIS: Drug-induced pancytopenia.   CURRENT THERAPY: Observation.  INTERVAL HISTORY: ASBURY HAIR 78 y.o. male returns to the clinic today for three-month followup visit accompanied by his wife. The patient has no complaints today except for a tick bite in the right buttock area. He removed it but it left him with an area of skin rash and erythema. He denied having any significant weight loss or night sweats. No bleeding issues. This is currently on Integra plus one capsule by mouth daily and he is tolerating it fairly well. He has repeat CBC performed earlier today and he is here for evaluation and discussion of his lab results.  MEDICAL HISTORY: Past Medical History  Diagnosis Date  . NASH (nonalcoholic steatohepatitis)   . DIABETES MELLITUS, TYPE II 11/02/2006  . Pure hypercholesterolemia 10/08/2007  . HYPERLIPIDEMIA-MIXED 07/02/2009  . GOUT 06/14/2007  . THROMBOCYTOPENIA 12/17/2008  . HYPERTENSION 10/08/2007  . CORONARY ARTERY DISEASE 11/02/2006  . CORONARY ATHEROSCLEROSIS NATIVE CORONARY ARTERY 11/17/2009  . AORTIC STENOSIS 07/02/2009  . GERD 11/02/2006  . FATTY LIVER DISEASE 10/08/2007  . HYPERTROPHY PROSTATE W/UR OBST & OTH LUTS 02/21/2009    ALLERGIES:  is allergic to azor.  MEDICATIONS:  Current Outpatient Prescriptions  Medication Sig Dispense Refill  . allopurinol (ZYLOPRIM) 100 MG tablet Take 100 mg by mouth as needed.       Marland Kitchen amLODipine (NORVASC) 2.5 MG tablet Take 1 tablet (2.5 mg total) by mouth daily.  30 tablet  10  . bromocriptine (PARLODEL) 2.5 MG tablet Take 1 tablet (2.5 mg total) by mouth at bedtime.  30 tablet  11  . finasteride (PROSCAR) 5 MG tablet Take 1 tablet (5 mg total) by mouth daily.  30 tablet  5  . folic acid (FOLVITE) 1 MG tablet Take 1 tablet (1 mg total) by  mouth daily.  30 tablet  10  . glucose blood (ONE TOUCH ULTRA TEST) test strip Use as instructed. Check bs four times a day       . metFORMIN (GLUCOPHAGE) 500 MG tablet Take 500 mg by mouth daily with breakfast.      . nitroGLYCERIN (NITROSTAT) 0.4 MG SL tablet Place 0.4 mg under the tongue every 5 (five) minutes as needed. At onset of chest pain for up to 3 doses       . olmesartan (BENICAR) 40 MG tablet Take 1 tablet (40 mg total) by mouth daily.  30 tablet  10  . omeprazole (PRILOSEC OTC) 20 MG tablet Take 1 tablet (20 mg total) by mouth daily.  30 tablet  10  . ONE TOUCH LANCETS MISC Use four times a day       . pravastatin (PRAVACHOL) 80 MG tablet 1/2 tablet at bedtime  15 tablet  5  . sitaGLIPtin (JANUVIA) 100 MG tablet Take 100 mg by mouth daily.      . Tamsulosin HCl (FLOMAX) 0.4 MG CAPS 1 capsule by mouth once daily       . DISCONTD: sitaGLIPtan (JANUVIA) 100 MG tablet Take 1 tablet (100 mg total) by mouth daily.  30 tablet  11    SURGICAL HISTORY:  Past Surgical History  Procedure Date  . Coronary artery bypass graft   . Stress cardiolite 08/11/2006  . Transthoracic echocardiogram 08/11/2006    REVIEW OF SYSTEMS:  A comprehensive review of systems was negative.   PHYSICAL EXAMINATION: General appearance: alert, cooperative and no distress Neck: no adenopathy Lymph nodes: Cervical, supraclavicular, and axillary nodes normal. Resp: clear to auscultation bilaterally Cardio: regular rate and rhythm, S1, S2 normal, no murmur, click, rub or gallop GI: soft, non-tender; bowel sounds normal; no masses,  no organomegaly Extremities: extremities normal, atraumatic, no cyanosis or edema  ECOG PERFORMANCE STATUS: 0 - Asymptomatic  Blood pressure 134/69, pulse 65, temperature 97 F (36.1 C), temperature source Oral, height 5\' 7"  (1.702 m), weight 173 lb 3.2 oz (78.563 kg).  LABORATORY DATA: Lab Results  Component Value Date   WBC 3.3* 07/19/2011   HGB 12.3* 07/19/2011   HCT 35.9*  07/19/2011   MCV 85.1 07/19/2011   PLT 50* 07/19/2011      Chemistry      Component Value Date/Time   NA 139 04/20/2011 1424   K 4.1 04/20/2011 1424   CL 107 04/20/2011 1424   CO2 22 04/20/2011 1424   BUN 17 04/20/2011 1424   CREATININE 1.32 04/20/2011 1424      Component Value Date/Time   CALCIUM 9.2 04/20/2011 1424   ALKPHOS 121* 04/26/2011 0926   AST 27 04/26/2011 0926   ALT 15 04/26/2011 0926   BILITOT 1.6* 04/26/2011 0926       RADIOGRAPHIC STUDIES: No results found.  ASSESSMENT: This is a very pleasant 78 years old white male with history of pancytopenia currently on observation. The patient is doing fine but he continues to have pancytopenia. There is some improvement in his leukocyte count as well as anemia.  PLAN: I recommended for him to continue on Integra plus for now. I would see him back for followup visit in 3 months for reevaluation with repeat CBC and comprehensive metabolic panel. He was advised to call me immediately she has any concerning symptoms in the interval especially any bleeding issues.  All questions were answered. The patient knows to call the clinic with any problems, questions or concerns. We can certainly see the patient much sooner if necessary.

## 2011-07-21 ENCOUNTER — Telehealth: Payer: Self-pay | Admitting: *Deleted

## 2011-07-21 NOTE — Telephone Encounter (Signed)
Pt states that he had a recent tick bite and was seen at a local UC for evaluation. Pt was prescribed Doxycycline 100mg  and wants to know if this medication is safe for him to take with his other medications.

## 2011-07-22 NOTE — Telephone Encounter (Signed)
Left message for pt to callback office.  

## 2011-07-22 NOTE — Telephone Encounter (Signed)
Pt informed of MD's advisement. 

## 2011-07-22 NOTE — Telephone Encounter (Signed)
Yes, fine

## 2011-07-29 MED ORDER — INTEGRA PLUS PO CAPS: 1.0000 | ORAL_CAPSULE | Freq: Every day | ORAL | Status: DC

## 2011-07-29 NOTE — Progress Notes (Signed)
Addended by: Charma Igo on: 07/29/2011 01:18 PM   Modules accepted: Orders

## 2011-08-11 ENCOUNTER — Other Ambulatory Visit (INDEPENDENT_AMBULATORY_CARE_PROVIDER_SITE_OTHER): Payer: Medicare Other

## 2011-08-11 ENCOUNTER — Ambulatory Visit (INDEPENDENT_AMBULATORY_CARE_PROVIDER_SITE_OTHER): Payer: Medicare Other | Admitting: Endocrinology

## 2011-08-11 ENCOUNTER — Encounter: Payer: Self-pay | Admitting: Endocrinology

## 2011-08-11 VITALS — BP 130/68 | HR 59 | Temp 97.5°F | Ht 67.0 in | Wt 173.0 lb

## 2011-08-11 DIAGNOSIS — E1129 Type 2 diabetes mellitus with other diabetic kidney complication: Secondary | ICD-10-CM | POA: Insufficient documentation

## 2011-08-11 DIAGNOSIS — N058 Unspecified nephritic syndrome with other morphologic changes: Secondary | ICD-10-CM

## 2011-08-11 MED ORDER — METFORMIN HCL ER 500 MG PO TB24: 500.0000 mg | ORAL_TABLET | Freq: Every day | ORAL | Status: AC

## 2011-08-11 MED ORDER — BROMOCRIPTINE MESYLATE 2.5 MG PO TABS: 2.5000 mg | ORAL_TABLET | Freq: Two times a day (BID) | ORAL | Status: DC

## 2011-08-11 NOTE — Progress Notes (Signed)
Subjective:    Patient ID: Matthew Singh, male    DOB: [birth year], 78 y.o.   MRN: [id number]  HPI Pt returns for f/u of type 2 DM (dx'ed 2005; complicated by nephropathy PAD, and CAD).  He stopped [place], due to the cost.  He says he can't afford it.  He is uncertain why he is not taking metformin.   Past Medical History  Diagnosis Date  . NASH (nonalcoholic steatohepatitis)   . DIABETES MELLITUS, TYPE II 11/02/2006  . Pure hypercholesterolemia 10/08/2007  . HYPERLIPIDEMIA-MIXED 07/02/2009  . GOUT 06/14/2007  . THROMBOCYTOPENIA 12/17/2008  . HYPERTENSION 10/08/2007  . CORONARY ARTERY DISEASE 11/02/2006  . CORONARY ATHEROSCLEROSIS NATIVE CORONARY ARTERY 11/17/2009  . AORTIC STENOSIS 07/02/2009  . GERD 11/02/2006  . FATTY LIVER DISEASE 10/08/2007  . HYPERTROPHY PROSTATE W/UR OBST & OTH LUTS 02/21/2009    Past Surgical History  Procedure Date  . Coronary artery bypass graft   . Stress cardiolite 08/11/2006  . Transthoracic echocardiogram 08/11/2006    History   Social History  . Marital Status: Married    Spouse Name: N/A    Number of Children: N/A  . Years of Education: N/A   Occupational History  .      retired   Social History Main Topics  . Smoking status: Never Smoker   . Smokeless tobacco: Not on file  . Alcohol Use:   . Drug Use:   . Sexually Active:    Other Topics Concern  . Not on file   Social History Narrative  . No narrative on file    Current Outpatient Prescriptions on File Prior to Visit  Medication Sig Dispense Refill  . allopurinol (ZYLOPRIM) 100 MG tablet Take 100 mg by mouth as needed.       Marland Kitchen amLODipine (NORVASC) 2.5 MG tablet Take 1 tablet (2.5 mg total) by mouth daily.  30 tablet  10  . bromocriptine (PARLODEL) 2.5 MG tablet Take 1 tablet (2.5 mg total) by mouth 2 (two) times daily.  60 tablet  11  . FeFum-FePoly-FA-B Cmp-C-Biot (INTEGRA PLUS) CAPS Take 1 capsule by mouth daily.  30 capsule  5  . finasteride (PROSCAR) 5 MG tablet Take 1 tablet  (5 mg total) by mouth daily.  30 tablet  5  . folic acid (FOLVITE) 1 MG tablet Take 1 tablet (1 mg total) by mouth daily.  30 tablet  10  . glucose blood (ONE TOUCH ULTRA TEST) test strip Use as instructed. Check bs four times a day       . nitroGLYCERIN (NITROSTAT) 0.4 MG SL tablet Place 0.4 mg under the tongue every 5 (five) minutes as needed. At onset of chest pain for up to 3 doses       . omeprazole (PRILOSEC OTC) 20 MG tablet Take 1 tablet (20 mg total) by mouth daily.  30 tablet  10  . ONE TOUCH LANCETS MISC Use four times a day       . pravastatin (PRAVACHOL) 80 MG tablet 1/2 tablet at bedtime  15 tablet  5  . olmesartan (BENICAR) 40 MG tablet Take 1 tablet (40 mg total) by mouth daily.  30 tablet  10  . sitaGLIPtin (JANUVIA) 100 MG tablet Take 100 mg by mouth daily.      . Tamsulosin HCl (FLOMAX) 0.4 MG CAPS 1 capsule by mouth once daily         Allergies  Allergen Reactions  . Azor (Amlodipine-Olmesartan) Rash    Family History  Problem Relation Age of Onset  . Cancer Mother     pancreatic cancer  . Cancer Sister     colon cancer (advanced age)    BP 130/68  Pulse 59  Temp(Src) 97.5 F (36.4 C) (Oral)  Ht 5\' 7"  (1.702 m)  Wt 173 lb (78.472 kg)  BMI 27.10 kg/m2  SpO2 95%  Review of Systems denies hypoglycemia    Objective:   Physical Exam VITAL SIGNS:  See vs page GENERAL: no distress PSYCH: Alert and oriented x 3.  Does not appear anxious nor depressed.  Lab Results  Component Value Date   HGBA1C 6.5 08/11/2011      Assessment & Plan:  DM, therapy limited by noncompliance.  i'll do the best i can.

## 2011-08-11 NOTE — Patient Instructions (Addendum)
Here are some samples of januvia, as well as "tradjenta," and "onglyza" (both interchangeable with [place]). blood tests are being requested for you today.  You will receive a letter with results.   increase "bromocriptine" to 1 pill, 2x a day. Please come back for a follow-up appointment in 3 months.  Resume the metformin.  i have sent a prescription to your pharmacy.

## 2011-08-12 ENCOUNTER — Telehealth: Payer: Self-pay | Admitting: *Deleted

## 2011-08-12 NOTE — Telephone Encounter (Signed)
Called pt to inform of lab results, no answer/unable to leave message (letter also mailed to pt).  

## 2011-08-16 NOTE — Telephone Encounter (Signed)
Pt informed of lab results. 

## 2011-10-18 ENCOUNTER — Other Ambulatory Visit (HOSPITAL_BASED_OUTPATIENT_CLINIC_OR_DEPARTMENT_OTHER): Payer: Medicare Other

## 2011-10-18 ENCOUNTER — Telehealth: Payer: Self-pay | Admitting: Internal Medicine

## 2011-10-18 ENCOUNTER — Ambulatory Visit (HOSPITAL_BASED_OUTPATIENT_CLINIC_OR_DEPARTMENT_OTHER): Payer: Medicare Other | Admitting: Internal Medicine

## 2011-10-18 VITALS — BP 117/64 | HR 61 | Temp 97.4°F | Ht 67.0 in | Wt 171.1 lb

## 2011-10-18 DIAGNOSIS — D696 Thrombocytopenia, unspecified: Secondary | ICD-10-CM

## 2011-10-18 DIAGNOSIS — D61818 Other pancytopenia: Secondary | ICD-10-CM

## 2011-10-18 LAB — CBC WITH DIFFERENTIAL/PLATELET
Basophils Absolute: 0 10*3/uL (ref 0.0–0.1)
EOS%: 3.1 % (ref 0.0–7.0)
Eosinophils Absolute: 0.1 10*3/uL (ref 0.0–0.5)
HGB: 14 g/dL (ref 13.0–17.1)
LYMPH%: 18.1 % (ref 14.0–49.0)
MCH: 32.3 pg (ref 27.2–33.4)
MCV: 92.5 fL (ref 79.3–98.0)
MONO%: 7.3 % (ref 0.0–14.0)
NEUT#: 3.4 10*3/uL (ref 1.5–6.5)
Platelets: 63 10*3/uL — ABNORMAL LOW (ref 140–400)
RBC: 4.35 10*6/uL (ref 4.20–5.82)

## 2011-10-18 LAB — COMPREHENSIVE METABOLIC PANEL
Alkaline Phosphatase: 148 U/L — ABNORMAL HIGH (ref 39–117)
BUN: 17 mg/dL (ref 6–23)
Glucose, Bld: 131 mg/dL — ABNORMAL HIGH (ref 70–99)
Total Bilirubin: 1.8 mg/dL — ABNORMAL HIGH (ref 0.3–1.2)

## 2011-10-18 NOTE — Telephone Encounter (Signed)
gv pt appt schedule for January 2014.  °

## 2011-10-18 NOTE — Progress Notes (Signed)
[facility] [phone]   [phone]  OFFICE PROGRESS NOTE  Romero Belling, MD [address] [place] [address]  PRINCIPAL DIAGNOSIS: Drug-induced pancytopenia.   CURRENT THERAPY: Observation.   INTERVAL HISTORY: Matthew Singh 78 y.o. male returns to the clinic today for three-month followup visit accompanied by his wife. The patient has no complaints today. He denied having any significant bleeding issues, no bruises or ecchymosis. He has no weight loss or night sweats. No chest pain or shortness of breath. He has repeat CBC performed earlier today and he is here for evaluation and discussion of his lab results.  MEDICAL HISTORY: Past Medical History  Diagnosis Date  . NASH (nonalcoholic steatohepatitis)   . DIABETES MELLITUS, TYPE II 11/02/2006  . Pure hypercholesterolemia 10/08/2007  . HYPERLIPIDEMIA-MIXED 07/02/2009  . GOUT 06/14/2007  . THROMBOCYTOPENIA 12/17/2008  . HYPERTENSION 10/08/2007  . CORONARY ARTERY DISEASE 11/02/2006  . CORONARY ATHEROSCLEROSIS NATIVE CORONARY ARTERY 11/17/2009  . AORTIC STENOSIS 07/02/2009  . GERD 11/02/2006  . FATTY LIVER DISEASE 10/08/2007  . HYPERTROPHY PROSTATE W/UR OBST & OTH LUTS 02/21/2009    ALLERGIES:  is allergic to azor.  MEDICATIONS:  Current Outpatient Prescriptions  Medication Sig Dispense Refill  . allopurinol (ZYLOPRIM) 100 MG tablet Take 100 mg by mouth as needed.       Marland Kitchen amLODipine (NORVASC) 2.5 MG tablet Take 1 tablet (2.5 mg total) by mouth daily.  30 tablet  10  . bromocriptine (PARLODEL) 2.5 MG tablet Take 1 tablet (2.5 mg total) by mouth 2 (two) times daily.  60 tablet  11  . FeFum-FePoly-FA-B Cmp-C-Biot (INTEGRA PLUS) CAPS Take 1 capsule by mouth daily.  30 capsule  5  . finasteride (PROSCAR) 5 MG tablet Take 1 tablet (5 mg total) by mouth daily.  30 tablet  5  . folic acid (FOLVITE) 1 MG tablet Take 1 tablet (1 mg total) by mouth daily.  30 tablet  10  . glucose  blood (ONE TOUCH ULTRA TEST) test strip Use as instructed. Check bs four times a day       . metFORMIN (GLUCOPHAGE-XR) 500 MG 24 hr tablet Take 1 tablet (500 mg total) by mouth daily with breakfast.  30 tablet  11  . nitroGLYCERIN (NITROSTAT) 0.4 MG SL tablet Place 0.4 mg under the tongue every 5 (five) minutes as needed. At onset of chest pain for up to 3 doses       . olmesartan (BENICAR) 40 MG tablet Take 1 tablet (40 mg total) by mouth daily.  30 tablet  10  . omeprazole (PRILOSEC OTC) 20 MG tablet Take 1 tablet (20 mg total) by mouth daily.  30 tablet  10  . ONE TOUCH LANCETS MISC Use four times a day       . pravastatin (PRAVACHOL) 80 MG tablet 1/2 tablet at bedtime  15 tablet  5  . sitaGLIPtin (JANUVIA) 100 MG tablet Take 100 mg by mouth daily.      . Tamsulosin HCl (FLOMAX) 0.4 MG CAPS 1 capsule by mouth once daily         SURGICAL HISTORY:  Past Surgical History  Procedure Date  . Coronary artery bypass graft   . Stress cardiolite 08/11/2006  . Transthoracic echocardiogram 08/11/2006    REVIEW OF SYSTEMS:  A comprehensive review of systems was negative.   PHYSICAL EXAMINATION: General appearance: alert, cooperative and no distress Neck: no adenopathy Lymph nodes: Cervical, supraclavicular, and  axillary nodes normal. Resp: clear to auscultation bilaterally Cardio: regular rate and rhythm, S1, S2 normal, no murmur, click, rub or gallop GI: soft, non-tender; bowel sounds normal; no masses,  no organomegaly Extremities: extremities normal, atraumatic, no cyanosis or edema  ECOG PERFORMANCE STATUS: 0 - Asymptomatic  Blood pressure 117/64, pulse 61, temperature 97.4 F (36.3 C), temperature source Oral, height 5\' 7"  (1.702 m), weight 171 lb 1.6 oz (77.61 kg).  LABORATORY DATA: Lab Results  Component Value Date   WBC 4.8 10/18/2011   HGB 14.0 10/18/2011   HCT 40.2 10/18/2011   MCV 92.5 10/18/2011   PLT 63* 10/18/2011      Chemistry      Component Value Date/Time   NA 139  07/19/2011 1208   K 4.1 07/19/2011 1208   CL 105 07/19/2011 1208   CO2 24 07/19/2011 1208   BUN 17 07/19/2011 1208   CREATININE 1.32 07/19/2011 1208      Component Value Date/Time   CALCIUM 9.2 07/19/2011 1208   ALKPHOS 143* 07/19/2011 1208   AST 25 07/19/2011 1208   ALT 12 07/19/2011 1208   BILITOT 1.1 07/19/2011 1208       RADIOGRAPHIC STUDIES: No results found.  ASSESSMENT: This is a very pleasant 78 years old white male with questionable drug induced pancytopenia, completely resolved except for persistent thrombocytopenia, likely ITP. His platelets count are better on today's lab compared to 3 months ago. He also has improvement in his white blood count as well as hemoglobin and hematocrit.  PLAN: I discussed the lab result with the patient and his wife. I recommended for him to continue on observation for now with repeat CBC and LDH in 6 months. He was advised to call me immediately she has any concerning symptoms in the interval especially any bleeding issues, bruises or ecchymosis.  All questions were answered. The patient knows to call the clinic with any problems, questions or concerns. We can certainly see the patient much sooner if necessary.

## 2011-11-11 ENCOUNTER — Other Ambulatory Visit (INDEPENDENT_AMBULATORY_CARE_PROVIDER_SITE_OTHER): Payer: Medicare Other

## 2011-11-11 ENCOUNTER — Ambulatory Visit (INDEPENDENT_AMBULATORY_CARE_PROVIDER_SITE_OTHER): Payer: Medicare Other | Admitting: Endocrinology

## 2011-11-11 ENCOUNTER — Encounter: Payer: Self-pay | Admitting: Endocrinology

## 2011-11-11 VITALS — BP 126/72 | HR 63 | Temp 97.2°F | Ht 67.0 in | Wt 175.0 lb

## 2011-11-11 DIAGNOSIS — Z79899 Other long term (current) drug therapy: Secondary | ICD-10-CM

## 2011-11-11 DIAGNOSIS — E1129 Type 2 diabetes mellitus with other diabetic kidney complication: Secondary | ICD-10-CM

## 2011-11-11 DIAGNOSIS — N058 Unspecified nephritic syndrome with other morphologic changes: Secondary | ICD-10-CM

## 2011-11-11 NOTE — Patient Instructions (Addendum)
blood tests are being requested for you today.  You will receive a letter with results. Please come back for a regular physical appointment in 3 months. check your blood sugar once a day.  vary the time of day when you check, between before the 3 meals, and at bedtime.  also check if you have symptoms of your blood sugar being too high or too low.  please keep a record of the readings and bring it to your next appointment here.  please call us sooner if your blood sugar goes below 70, or if you have a lot of readings over 200.

## 2011-11-11 NOTE — Progress Notes (Signed)
Subjective:    Patient ID: Matthew Singh, male    DOB: 01/11/1933, 78 y.o.   MRN: [id number]  HPI Pt returns for f/u of type 2 DM (dx'ed 2005; complicated by nephropathy PAD, and CAD).  no cbg record, but states cbg's are well-controlled.  He thinks he is not taking the parlodel. Past Medical History  Diagnosis Date  . NASH (nonalcoholic steatohepatitis)   . DIABETES MELLITUS, TYPE II 11/02/2006  . Pure hypercholesterolemia 10/08/2007  . HYPERLIPIDEMIA-MIXED 07/02/2009  . GOUT 06/14/2007  . THROMBOCYTOPENIA 12/17/2008  . HYPERTENSION 10/08/2007  . CORONARY ARTERY DISEASE 11/02/2006  . CORONARY ATHEROSCLEROSIS NATIVE CORONARY ARTERY 11/17/2009  . AORTIC STENOSIS 07/02/2009  . GERD 11/02/2006  . FATTY LIVER DISEASE 10/08/2007  . HYPERTROPHY PROSTATE W/UR OBST & OTH LUTS 02/21/2009    Past Surgical History  Procedure Date  . Coronary artery bypass graft   . Stress cardiolite 08/11/2006  . Transthoracic echocardiogram 08/11/2006    History   Social History  . Marital Status: Married    Spouse Name: N/A    Number of Children: N/A  . Years of Education: N/A   Occupational History  .      retired   Social History Main Topics  . Smoking status: Never Smoker   . Smokeless tobacco: Not on file  . Alcohol Use:   . Drug Use:   . Sexually Active:    Other Topics Concern  . Not on file   Social History Narrative  . No narrative on file    Current Outpatient Prescriptions on File Prior to Visit  Medication Sig Dispense Refill  . allopurinol (ZYLOPRIM) 100 MG tablet Take 100 mg by mouth as needed.       Marland Kitchen amLODipine (NORVASC) 2.5 MG tablet Take 1 tablet (2.5 mg total) by mouth daily.  30 tablet  10  . bromocriptine (PARLODEL) 2.5 MG tablet Take 1 tablet (2.5 mg total) by mouth 2 (two) times daily.  60 tablet  11  . FeFum-FePoly-FA-B Cmp-C-Biot (INTEGRA PLUS) CAPS Take 1 capsule by mouth daily.  30 capsule  5  . finasteride (PROSCAR) 5 MG tablet Take 1 tablet (5 mg total) by mouth  daily.  30 tablet  5  . folic acid (FOLVITE) 1 MG tablet Take 1 tablet (1 mg total) by mouth daily.  30 tablet  10  . glucose blood (ONE TOUCH ULTRA TEST) test strip Use as instructed. Check bs four times a day       . metFORMIN (GLUCOPHAGE-XR) 500 MG 24 hr tablet Take 1 tablet (500 mg total) by mouth daily with breakfast.  30 tablet  11  . nitroGLYCERIN (NITROSTAT) 0.4 MG SL tablet Place 0.4 mg under the tongue every 5 (five) minutes as needed. At onset of chest pain for up to 3 doses       . olmesartan (BENICAR) 40 MG tablet Take 1 tablet (40 mg total) by mouth daily.  30 tablet  10  . omeprazole (PRILOSEC OTC) 20 MG tablet Take 1 tablet (20 mg total) by mouth daily.  30 tablet  10  . ONE TOUCH LANCETS MISC Use four times a day       . pravastatin (PRAVACHOL) 80 MG tablet 1/2 tablet at bedtime  15 tablet  5  . sitaGLIPtin (JANUVIA) 100 MG tablet Take 100 mg by mouth daily.      . Tamsulosin HCl (FLOMAX) 0.4 MG CAPS 1 capsule by mouth once daily  Allergies  Allergen Reactions  . Azor (Amlodipine-Olmesartan) Rash    Family History  Problem Relation Age of Onset  . Cancer Mother     pancreatic cancer  . Cancer Sister     colon cancer (advanced age)    BP 126/72  Pulse 63  Temp 97.2 F (36.2 C) (Oral)  Ht 5\' 7"  (1.702 m)  Wt 175 lb (79.379 kg)  BMI 27.41 kg/m2  SpO2 95%  Review of Systems denies hypoglycemia    Objective:   Physical Exam Pulses: dorsalis pedis intact bilat.   Feet: no deformity.  no ulcer on the feet.  feet are of normal color and temp.  no edema.  There is bilateral onychomycosis. There is a vein harvest site at the right leg.   Neuro: sensation is intact to touch on the feet.    Lab Results  Component Value Date   HGBA1C 6.0 11/11/2011      Assessment & Plan:  DM.  well-controlled

## 2011-11-14 ENCOUNTER — Telehealth: Payer: Self-pay | Admitting: *Deleted

## 2011-11-14 NOTE — Telephone Encounter (Signed)
Pt called stating that his integra plus rx is $30.00 monthly and he cannot afford this along with his other rx's.  Per Dr Donnald Garre, okay to take OTC iron 2 tabs daily.  Left msg on answering machine with instructions, pt is to call back with any questions.  SLJ

## 2011-11-24 ENCOUNTER — Telehealth: Payer: Self-pay | Admitting: Endocrinology

## 2011-11-24 DIAGNOSIS — R109 Unspecified abdominal pain: Secondary | ICD-10-CM | POA: Insufficient documentation

## 2011-11-24 NOTE — Telephone Encounter (Signed)
done

## 2011-11-24 NOTE — Telephone Encounter (Signed)
Pt wants a referral to Dr. Doylene Canning in [place] to check him to see if he needs gall bladder surgery.  He has already made an appt for Tues Sept 10.  He needs the referral sent to their office.

## 2011-11-24 NOTE — Telephone Encounter (Signed)
Pt informed

## 2011-11-25 ENCOUNTER — Telehealth: Payer: Self-pay | Admitting: Internal Medicine

## 2011-11-25 NOTE — Telephone Encounter (Signed)
plz return call to patient at [phone], pt c/o of possible gall bladder issues. plz return call to discuss past test results.

## 2011-11-25 NOTE — Telephone Encounter (Signed)
LMOM for call back. 

## 2011-11-25 NOTE — Telephone Encounter (Signed)
Follow-up:     Patient called you to speak about his liver.  Please call back.

## 2011-11-28 NOTE — Telephone Encounter (Signed)
F/u  Please call regarding upcoming appt on tomorrow.

## 2011-11-28 NOTE — Telephone Encounter (Signed)
Spoke with patient's wife. She states that he called to get a referral for a surgeon for gallbladder problems. Advised her that he needs to get this from his primary care doctor.

## 2011-11-28 NOTE — Telephone Encounter (Signed)
Pt rtn jackie[facility] call pls call @ [phone]

## 2011-12-09 ENCOUNTER — Ambulatory Visit (INDEPENDENT_AMBULATORY_CARE_PROVIDER_SITE_OTHER): Payer: Medicare Other | Admitting: Surgery

## 2011-12-22 ENCOUNTER — Other Ambulatory Visit: Payer: Self-pay | Admitting: General Practice

## 2011-12-22 ENCOUNTER — Telehealth: Payer: Self-pay | Admitting: Internal Medicine

## 2011-12-22 MED ORDER — OMEPRAZOLE MAGNESIUM 20 MG PO TBEC
20.0000 mg | DELAYED_RELEASE_TABLET | Freq: Every day | ORAL | Status: DC
Start: 2011-12-22 — End: 2013-03-03

## 2011-12-22 NOTE — Telephone Encounter (Signed)
Spoke with pt, he is needing to have his gallbladder removed. He has an appt with [facility] on 12-29-11 for clearance but wanted to know if he could get clearance without being seen so he can go ahead and get the surgery scheduled. He is having nausea and vomiting. Will forward for dr Tenny Craw review.

## 2011-12-22 NOTE — Telephone Encounter (Signed)
Follow-up:    Patient called back wanting to speak with someone about his situation.  Please call bac

## 2011-12-22 NOTE — Telephone Encounter (Signed)
plz return call  To patient [phone] regarding gall bladder surgery .

## 2011-12-22 NOTE — Telephone Encounter (Signed)
Spoke with Matthew Singh, aware dr Tenny Craw will be back into the office tomorrow and will discuss with her then.

## 2011-12-22 NOTE — Telephone Encounter (Signed)
Pt last seen on 11/11/11.

## 2011-12-23 NOTE — Telephone Encounter (Signed)
Patient had stress test in march of this year that was normal. If he is have no SOB or CP and if he has documentation that N/V appears to be from gallbladder problems then he is at low risk for planned surgery.  OK to proceed. I did not see visit to surgeon in [facility]

## 2011-12-23 NOTE — Telephone Encounter (Signed)
Spoke with pt, he is seeing dr mark beachum in [place]. Will forward note to surgeon. appt cx next week with PAC. He will follow up with dr Tenny Craw as scheduled.

## 2011-12-29 ENCOUNTER — Ambulatory Visit: Payer: Medicare Other | Admitting: Physician Assistant

## 2012-01-10 ENCOUNTER — Telehealth: Payer: Self-pay | Admitting: Endocrinology

## 2012-01-10 ENCOUNTER — Telehealth: Payer: Self-pay | Admitting: Internal Medicine

## 2012-01-10 NOTE — Telephone Encounter (Signed)
Closest i can find is blood pressure meds.  Please continue

## 2012-01-10 NOTE — Telephone Encounter (Signed)
New Problem:    Patient called in because he is going to have gallbladder removal surgery tomorrow and was told to take his medication prescribed for his heart.  Patient is unsure what specific medication was prescribed for his heart.  Please call back.

## 2012-01-10 NOTE — Telephone Encounter (Signed)
F/u   Patient is calling back for status update on this matter, plz return call

## 2012-01-10 NOTE — Telephone Encounter (Signed)
Patient called to make sure he takes the correct heart medication prior Golbladder surgery tomorrow. RN  went over the list of medication with pt to identify medications and  The Diagnosis  he is taken them for. Pt verbalized understanding.

## 2012-01-10 NOTE — Telephone Encounter (Signed)
Pt is supposed to have gall bladder sx in the morning. Pt was advised by surgeon to take his "heart medicine" but he has been through all his medication and can't find what they meant. He called to see if Dr. Everardo All knew which med he is supposed to take. Please advise.

## 2012-01-10 NOTE — Telephone Encounter (Signed)
Pt.notified

## 2012-01-14 DIAGNOSIS — K839 Disease of biliary tract, unspecified: Secondary | ICD-10-CM | POA: Insufficient documentation

## 2012-01-27 DIAGNOSIS — Z9049 Acquired absence of other specified parts of digestive tract: Secondary | ICD-10-CM | POA: Insufficient documentation

## 2012-02-01 ENCOUNTER — Encounter: Payer: Self-pay | Admitting: Endocrinology

## 2012-02-10 ENCOUNTER — Ambulatory Visit: Payer: Medicare Other | Admitting: Endocrinology

## 2012-02-14 ENCOUNTER — Encounter: Payer: Self-pay | Admitting: Endocrinology

## 2012-02-15 DIAGNOSIS — I472 Ventricular tachycardia: Secondary | ICD-10-CM | POA: Insufficient documentation

## 2012-02-15 DIAGNOSIS — Z951 Presence of aortocoronary bypass graft: Secondary | ICD-10-CM | POA: Insufficient documentation

## 2012-02-15 DIAGNOSIS — D649 Anemia, unspecified: Secondary | ICD-10-CM | POA: Insufficient documentation

## 2012-02-15 DIAGNOSIS — I340 Nonrheumatic mitral (valve) insufficiency: Secondary | ICD-10-CM | POA: Insufficient documentation

## 2012-02-15 DIAGNOSIS — I272 Pulmonary hypertension, unspecified: Secondary | ICD-10-CM | POA: Insufficient documentation

## 2012-03-08 ENCOUNTER — Other Ambulatory Visit: Payer: Self-pay

## 2012-03-08 MED ORDER — PRAVASTATIN SODIUM 80 MG PO TABS
ORAL_TABLET | ORAL | Status: DC
Start: 2012-03-08 — End: 2013-02-18

## 2012-03-16 ENCOUNTER — Telehealth: Payer: Self-pay | Admitting: Internal Medicine

## 2012-03-16 NOTE — Telephone Encounter (Signed)
called pt to r/s 04/17/12 appt and pt states taht he cannot come in anyway as he has had back surg with [facility] and has 6 tubes draining from

## 2012-04-05 ENCOUNTER — Encounter (INDEPENDENT_AMBULATORY_CARE_PROVIDER_SITE_OTHER): Payer: Self-pay | Admitting: Internal Medicine

## 2012-04-05 ENCOUNTER — Ambulatory Visit (INDEPENDENT_AMBULATORY_CARE_PROVIDER_SITE_OTHER): Payer: Medicare Other | Admitting: Internal Medicine

## 2012-04-05 ENCOUNTER — Other Ambulatory Visit (INDEPENDENT_AMBULATORY_CARE_PROVIDER_SITE_OTHER): Payer: Self-pay | Admitting: Internal Medicine

## 2012-04-05 VITALS — BP 130/70 | HR 76 | Temp 98.1°F | Resp 18 | Ht 67.0 in | Wt 156.1 lb

## 2012-04-05 DIAGNOSIS — R634 Abnormal weight loss: Secondary | ICD-10-CM

## 2012-04-05 DIAGNOSIS — K7460 Unspecified cirrhosis of liver: Secondary | ICD-10-CM

## 2012-04-05 DIAGNOSIS — R1031 Right lower quadrant pain: Secondary | ICD-10-CM

## 2012-04-05 NOTE — Patient Instructions (Signed)
Physician will contact you or family member with results of blood work. Remember not to eat raw fish. Call office with progress report in 3-4 weeks or earlier if abdominal pain gets worse.

## 2012-04-05 NOTE — Progress Notes (Signed)
CONSULTING PHYSICIAN: Dr. Andee Lineman.  REASON FOR CONSULTATION: Evaluation for cirrhosis.  Intermittent right lower quadrant abdominal pain and weight loss.  HISTORY OF PRESENT ILLNESS: Matthew Singh is a 79 year old Caucasian male who  is here for GI evaluation at request by  Dr. Andee Lineman. He is accompanied by his son Ladona Ridgel.  The patient has had thrombocytopenia for at least 5 years. He has been  seeing Dr. Gwenyth Bouillon for about 2 years. He has been evaluated and  finally felt to have thrombocytopenia secondary to liver disease. He  has felt to have cirrhosis secondary to NAFLD; however according to his  son and the records that are available, he has never had evaluation as  to look for etiology of cirrhosis. He denies history of hepatitis or  jaundice. He has never received blood transfusion. There is no history  of IV drug use. Family history is negative for chronic liver disease.  The patient also complains of intermittent pain in right lower quadrant.  He has had this pain since his cholecystectomy of October, 2013. Pain  starts in right lower quadrant and shoots up under the right rib cage.  It occurs usually when he eats his meals. He finally had his drain  removed few weeks ago, and he states he has had less pain. He also had  biliary stent removed by Dr. Othelia Pulling of [facility], 2 days ago. The stent  was placed for biliary leak back on January 16, 2012. He denies melena  or rectal bleeding. He generally has 1-2 bowel movements per day. He  had a normal colonoscopy by Dr. Ewing Schlein, 7 years ago. (screening exam).  He does not recall that he has ever had EGD. He has lost close to 20  pounds possibly in the last 3 months. He states his heartburn is well  controlled with medication and he denies nausea, vomiting, but he has  not had a good appetite, but he states he has lost taste for foods. He  has not had any vomiting spells since cholecystectomy. He does not feel  that his abdomen has been more  distended lately.  The patient also complains of feeling cold all the time. His thyroid  tests have been normal. His son Ladona Ridgel states that while back he was  having spells where he would be talking to himself. Apparently, he has  not had this lately. He did have a serum ammonia level checked by Dr.  Andee Lineman, on March 23, 2012, and was within normal limits.  CURRENT MEDICATIONS:  1. Allopurinol 100 mg p.o. daily p.r.n.  2. Amlodipine 2.5 mg p.o. daily.  3. Bromocriptine 2.5 mg p.o. b.i.d.  4. Ferrous sulfate 325 mg p.o. b.i.d.  5. Folic acid 1 mg p.o. daily.  6. Metformin 500 mg p.o. q.a.m.  7. Nitroglycerin 0.4 mg sublingual p.r.n. chest pain.  8. Omeprazole 20 mg p.o. q.a.m.  9. Pravastatin 40 mg p.o. q.a.m.  10.Tamsulosin 0.4 mg p.o. at bedtime.  11.He does not take any OTC meds.  PAST MEDICAL HISTORY:  1. Hypertension.  2. Hyperlipidemia.  3. History of gout.  4. Diabetes mellitus. He believes he has been diabetic for about 5  years. He is under care of Dr. Olen Cordial. His hemoglobin A1c  on November 11, 2011 was 6.0.  5. History of thrombocytopenia. He has had it for at least 5 years,  maybe longer. On workup, he was found to have cirrhosis.  6. Cirrhosis presumed to be secondary to NAFLD.  7. He has had  symptoms of GERD for over 2 years.  8. Coronary artery disease. He had CABG in 1993. He now has aortic  stenosis. He had echocardiography on January 12, 2012, and peak  gradient was 70 mmHg. He also had trace aortic insufficiency.  Aortic valve area by VTI was 0.95 cm2, and LVOT velocity 4.17  m/sec. He have 65%. By these criteria, he was felt to have severe  aortic stenosis.  9. BPH. He did have a cystoscopy.  10.He had surgery on right shoulder for rotator cuff tear in 1997. He  had lumbar spine surgery for disk disease in 1962.  11.More recently he had cholecystectomy with Dr. Samara Deist of [facility] in [place] for symptomatic  cholelithiasis.  He was given platelets pre-transfusion. He was  noted to have cirrhotic-appearing liver, but a biopsy was not  taken. This surgery was complicated by a biliary leak, and he had  a stent placed in October, 2013, and removed 2 days ago by Dr.  Othelia Pulling of [facility].  ALLERGIES: To morphine resulting in confusion. He is also allergic to  olmesartan (rash).  FAMILY HISTORY: Noncontributory. He has a brother living who is 56  years old and doing fairly well. He had 1 sister who died in her 77s of  MI.  SOCIAL HISTORY: He is married. His wife also has medical problems,  including peripheral neuropathy. He has 2 sons and 1 of his sons is  with him and Ladona Ridgel lives about 5 miles from his parents. 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 He  smoked about a pack a day for 6 years, but quit in 1972, and he has had  very few drinks of alcohol in his lifetime and has not had 1 recently.  PHYSICAL EXAMINATION: VITAL SIGNS: Weight 160 pounds, he is 67 inches  tall, pulse 76 per minute, blood pressure 130/70, respiratory rate is  18, temp is 98.1.  GENERAL: The patient is alert and appears to be in no acute distress.  He does not have asterixis.  HEENT: Conjunctiva are pink. Sclera are nonicteric. Pupils are equal,  reactive to light and no __________ noted.  Oropharyngeal mucosa is normal. He has upper and lower dentures in  place.  NECK: No neck masses or thyromegaly noted.  CARDIAC: With regular rhythm. Normal S1, S2. He has loud systolic  ejection murmur best heard at aortic area.  LUNGS: Auscultation of lungs reveal vesicular breath sounds  bilaterally.  CHEST: He has mild gynecomastia.  ABDOMEN: Full. Bowel sounds are normal. On palpation, soft abdomen  with easily palpable spleen. Liver edge is also easily palpable  particularly the left lobe which is firm with irregular service slightly  tender. Span is 13-14 cm. The flanks were full. Shifting dullness  is  absent.  EXTREMITIES: He has rash around his ankles (drug rash) close. He has  trace edema around his ankles.  LABORATORY DATA: Reviewed from [facility] from February 20, 2012, WBC 5.8, H  is 10.0, hematocrit 30.4, platelet count 147 K. Serum sodium 132,  potassium 3.8, chloride 101, CO2 of 24, BUN 16, glucose 117, creatinine  1.02, calcium 8.4, bilirubin 1.3, AP 265, AST 31, ALT 14. Total protein  6.3, and albumin 2.9.  Serum ammonia reportedly normal on March 23, 2012.  ANA and rheumatoid factors were negative on April 20, 2011. Iron  studies from April 20, 2011, serum iron 15, TIBC 392, saturation was  14%, and serum ferritin was also low at 11 ng/mL.  IMAGING STUDIES: Abdominopelvic CT from January 16, 2009, revealed  changes of cirrhosis with very prominent caudate lobe and mild  splenomegaly and possibly mesenteric varices and cholelithiasis.  Ultrasound from December 02, 2011, reveals heterogeneous hepatic  parenchyma with a nodularity, but no focal abnormalities. Once again,  the stones were identified. Gallbladder with spleen 13 cm and there was  no evidence of ascites.  ASSESSMENT: Zaion's GI problems can be reviewed as below.  1. Cirrhosis. Suspect his cirrhosis is either due to nonalcoholic  fatty liver disease or cryptogenic in origin, but need to look for  common etiologies such as, hep B or C as well as alpha-1  antitrypsin. His iron studies have been on the low side; therefore,  hemochromatosis has been essentially ruled out. We will also check  for Wilson's disease, but this is less likely. Similarly, his  autoimmune markers been negative; therefore auto in hepatitis  is also ruled out. He also needs to be screened for hepatocellular  carcinoma. His platelet count has gone up since his  cholecystectomy, which is very curious. It remains to be seen if  his platelet count would stay up or gradually dropped to his  baseline.  He also needs to be checked for  esophageal varices at some point, and we  will consider esophagogastroduodenoscopy following his next visit. He  has undergone 2 endoscopic retrograde cholangiopancreatographies, but he  did not have upper gastrointestinal tract examination with the forward  viewing scope.  1. Right lower quadrant abdominal pain. He seemed to be having less  pain since removal of percutaneous drain and remains to be seen if  this pattern will continue if not, he will need further evaluation.  2. Weight loss. He has lost 20 pounds in the last 3-4 months, which  appears to be well documented. It is possibly related to biliary  tract disease and the fact that he had leak and had to have to 2  ERCPs. We will monitor his weight closely.  RECOMMENDATIONS: The patient advised not to eat raw fish.  He will go to the lab for hepatitis B surface antigen, hepatitis C virus  antibody, hepatitis A antibody total, as well as alpha-1 antitrypsin,  ceruloplasmin, and alpha fetoprotein.  I have asked his son  Ladona Ridgel to call us if he develops confusion or if his  abdominal pain recurs in which case we will proceed with abdominopelvic  CT.  The patient will return for office visit in 8 weeks. We appreciate the  opportunity to participate in the care of this gentleman.  ______________________________  Lionel December, M.D.

## 2012-04-06 LAB — HEPATITIS A ANTIBODY, TOTAL: Hep A Total Ab: POSITIVE — AB

## 2012-04-06 LAB — CERULOPLASMIN: Ceruloplasmin: 32 mg/dL (ref 20–60)

## 2012-04-06 NOTE — Consult Note (Signed)
NAME:  Matthew Singh, Matthew Singh NO.:  MEDICAL RECORD NO.:  [id number]  LOCATION:                                 FACILITY:  PHYSICIAN:  Lionel December, M.D.    DATE OF BIRTH:  1933-01-10  DATE OF CONSULTATION:  04/05/2012 DATE OF DISCHARGE:                                CONSULTATION   CONSULTING PHYSICIAN:  Dr. Andee Lineman.  REASON FOR CONSULTATION:  Evaluation for cirrhosis.  Intermittent right lower quadrant abdominal pain and weight loss.  HISTORY OF PRESENT ILLNESS:  Mcgwire is a 79 year old Caucasian male who is here for GI evaluation at request by Dr. Andee Lineman.  He is accompanied by his son Ladona Ridgel.  The patient has had thrombocytopenia for at least 5 years.  He has been seeing Dr. Gwenyth Bouillon for about 2 years.  He has been evaluated and finally felt to have thrombocytopenia secondary to liver disease.  He has felt to have cirrhosis secondary to NAFLD; however according to his son and the records that are available, he has never had evaluation as to look for etiology of cirrhosis.  He denies history of hepatitis or jaundice.  He has never received blood transfusion.  There is no history of IV drug use.  Family history is negative for chronic liver disease.  The patient also complains of intermittent pain in right lower quadrant. He has had this pain since his cholecystectomy of October, 2013.  Pain starts in right lower quadrant and shoots up under the right rib cage. It occurs usually when he eats his meals.  He finally had his drain removed few weeks ago, and he states he has had less pain.  He also had biliary stent removed by Dr. Othelia Pulling of [facility], 2 days ago.  The stent was placed for biliary leak back on January 16, 2012.  He denies melena or rectal bleeding.  He generally has 1-2 bowel movements per day.  He had a normal colonoscopy by Dr. Ewing Schlein, 7 years ago.  (screening exam). He does not recall that he has ever had EGD.  He has lost close to 20 pounds  possibly in the last 3 months.  He states his heartburn is well controlled with medication and he denies nausea, vomiting, but he has not had a good appetite, but he states he has lost taste for foods.  He has not had any vomiting spells since cholecystectomy.  He does not feel that his abdomen has been more distended lately.  The patient also complains of feeling cold all the time.  His thyroid tests have been normal.  His son Ladona Ridgel states that while back he was having spells where he would be talking to himself.  Apparently, he has not had this lately.  He did have a serum ammonia level checked by Dr. Andee Lineman, on March 23, 2012, and was within normal limits.  CURRENT MEDICATIONS: 1. Allopurinol 100 mg p.o. daily p.r.n. 2. Amlodipine 2.5 mg p.o. daily. 3. Bromocriptine 2.5 mg p.o. b.i.d. 4. Ferrous sulfate 325 mg p.o. b.i.d. 5. Folic acid 1 mg p.o. daily. 6. Metformin 500 mg p.o. q.a.m. 7. Nitroglycerin 0.4 mg sublingual p.r.n. chest pain. 8.  Omeprazole 20 mg p.o. q.a.m. 9. Pravastatin 40 mg p.o. q.a.m. 10.Tamsulosin 0.4 mg p.o. at bedtime. 11.He does not take any OTC meds.  PAST MEDICAL HISTORY: 1. Hypertension. 2. Hyperlipidemia. 3. History of gout. 4. Diabetes mellitus.  He believes he has been diabetic for about 5     years.  He is under care of Dr. Olen Cordial.  His hemoglobin A1c     on November 11, 2011 was 6.0. 5. History of thrombocytopenia.  He has had it for at least 5 years,     maybe longer.  On workup, he was found to have cirrhosis. 6. Cirrhosis presumed to be secondary to NAFLD. 7. He has had symptoms of GERD for over 2 years. 8. Coronary artery disease.  He had CABG in 1993.  He now has aortic     stenosis.  He had echocardiography on January 12, 2012, and peak     gradient was 70 mmHg.  He also had trace aortic insufficiency.     Aortic valve area by VTI was 0.95 cm2, and LVOT velocity 4.17     m/sec.  He have 65%.  By these criteria, he was felt to have  severe     aortic stenosis. 9. BPH.  He did have a cystoscopy. 10.He had surgery on right shoulder for rotator cuff tear in 1997.  He     had lumbar spine surgery for disk disease in 1962. 11.More recently he had cholecystectomy with Dr. Samara Deist of [facility] in [place] for symptomatic     cholelithiasis.  He was given platelets pre-transfusion.  He was     noted to have cirrhotic-appearing liver, but a biopsy was not     taken.  This surgery was complicated by a biliary leak, and he had     a stent placed in October, 2013, and removed 2 days ago by Dr.     Othelia Pulling of [facility].  ALLERGIES:  To morphine resulting in confusion.  He is also allergic to olmesartan (rash).  FAMILY HISTORY:  Noncontributory.  He has a brother living who is 22 years old and doing fairly well.  He had 1 sister who died in her 43s of MI.  SOCIAL HISTORY:  He is married.  His wife also has medical problems, including peripheral neuropathy.  He has 2 sons and 1 of his sons is with him and Ladona Ridgel lives about 5 miles from his parents.  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  He smoked about a pack a day for 6 years, but quit in 1972, and he has had very few drinks of alcohol in his lifetime and has not had 1 recently.  PHYSICAL EXAMINATION:  VITAL SIGNS:  Weight 160 pounds, he is 67 inches tall, pulse 76 per minute, blood pressure 130/70, respiratory rate is 18, temp is 98.1. GENERAL:  The patient is alert and appears to be in no acute distress. He does not have asterixis. HEENT:  Conjunctiva are pink.  Sclera are nonicteric.  Pupils are equal, reactive to light and no __________ noted. Oropharyngeal mucosa is normal.  He has upper and lower dentures in place. NECK:  No neck masses or thyromegaly noted. CARDIAC:  With regular rhythm.  Normal S1, S2.  He has loud systolic ejection murmur best heard at aortic area. LUNGS:  Auscultation of  lungs reveal vesicular breath sounds bilaterally. CHEST:  He has  mild gynecomastia. ABDOMEN:  Full.  Bowel sounds are normal.  On palpation, soft abdomen with easily palpable spleen.  Liver edge is also easily palpable particularly the left lobe which is firm with irregular service slightly tender.  Span is 13-14 cm.  The flanks were full.  Shifting dullness is absent. EXTREMITIES:  He has rash around his ankles (drug rash) close.  He has trace edema around his ankles.  LABORATORY DATA:  Reviewed from [facility] from February 20, 2012, WBC 5.8, H is 10.0, hematocrit 30.4, platelet count 147 K. Serum sodium 132, potassium 3.8, chloride 101, CO2 of 24, BUN 16, glucose 117, creatinine 1.02, calcium 8.4, bilirubin 1.3, AP 265, AST 31, ALT 14.  Total protein 6.3, and albumin 2.9.  Serum ammonia reportedly normal on March 23, 2012.  ANA and rheumatoid factors were negative on April 20, 2011.  Iron studies from April 20, 2011, serum iron 15, TIBC 392, saturation was 14%, and serum ferritin was also low at 11 ng/mL.  IMAGING STUDIES:  Abdominopelvic CT from 01/31/2009, revealed changes of cirrhosis with very prominent caudate lobe and mild splenomegaly and possibly mesenteric varices and cholelithiasis.  Ultrasound from December 02, 2011, reveals heterogeneous hepatic parenchyma with a nodularity, but no focal abnormalities.  Once again, the stones were identified.  Gallbladder with spleen 13 cm and there was no evidence of ascites.  ASSESSMENT:  Jaquel's GI problems can be reviewed as below. 1. Cirrhosis.  Suspect his cirrhosis is either due to nonalcoholic     fatty liver disease or cryptogenic in origin, but need to look for     common etiologies such as, hep B or C as well as alpha-1     antitrypsin.  His iron study has been on the low side; therefore,     hemochromatosis has been essentially ruled out.  We will also check     for Wilson's disease, but this is less likely.   Similarly, his     autoimmune markers been negative; therefore, the loop for hepatitis     is also ruled out.  He also needs to be screened for hepatocellular     carcinoma.  His platelet count has gone up since his     cholecystectomy, which is very curious.  It remains to be seen if     his platelet count would stay up or gradually dropped to his     baseline.  He also needs to be checked for esophageal varices at some point, and we will consider esophagogastroduodenoscopy following his next visit.  He has undergone 2 endoscopic retrograde cholangiopancreatographies, but he did not have upper gastrointestinal tract examination with the forward viewing scope. 1. Right lower quadrant abdominal pain.  He seemed to be having less     pain since removal of percutaneous drain and remains to be seen if     this pattern will continue if not, he will need further evaluation. 2. Weight loss.  He has lost 20 pounds in the last 3-4 months, which     appears to be well documented.  It is possibly related to biliary     tract disease and the fact that he had leak and had to have to 2     ERCPs.  We will monitor his weight closely.  RECOMMENDATIONS:  The patient advised not to eat raw fish. He will go to the lab for hepatitis B surface antigen, hepatitis C virus antibody, hepatitis A antibody total, as well as  alpha-1 antitrypsin, ceruloplasmin, and alpha fetoprotein.  I have asked the son to call us if he develops confusion or if his abdominal pain recurs in which case we will proceed with abdominopelvic CT.  The patient will return for office visit in 8 weeks.  We appreciate the opportunity to participate in the care of this gentleman.          ______________________________ Lionel December, M.D.     NR/MEDQ  D:  04/05/2012  T:  04/06/2012  Job:  [id number]

## 2012-04-09 ENCOUNTER — Telehealth (INDEPENDENT_AMBULATORY_CARE_PROVIDER_SITE_OTHER): Payer: Self-pay | Admitting: *Deleted

## 2012-04-09 DIAGNOSIS — K7460 Unspecified cirrhosis of liver: Secondary | ICD-10-CM

## 2012-04-09 NOTE — Telephone Encounter (Signed)
Per Dr.Rehman the patient will need to have lab in 6 months

## 2012-04-16 ENCOUNTER — Encounter (INDEPENDENT_AMBULATORY_CARE_PROVIDER_SITE_OTHER): Payer: Self-pay

## 2012-04-17 ENCOUNTER — Ambulatory Visit: Payer: Medicare Other | Admitting: Internal Medicine

## 2012-04-17 ENCOUNTER — Other Ambulatory Visit: Payer: Medicare Other | Admitting: Lab

## 2012-05-29 ENCOUNTER — Ambulatory Visit (INDEPENDENT_AMBULATORY_CARE_PROVIDER_SITE_OTHER): Payer: Medicare Other | Admitting: Internal Medicine

## 2012-07-05 ENCOUNTER — Ambulatory Visit: Payer: Self-pay | Admitting: Family Medicine

## 2012-07-09 ENCOUNTER — Telehealth: Payer: Self-pay | Admitting: Family Medicine

## 2012-07-09 ENCOUNTER — Encounter: Payer: Self-pay | Admitting: *Deleted

## 2012-07-09 NOTE — Telephone Encounter (Signed)
Had heart surgery at [facility] on 06/29/12 with Dr. Dion Body.  Appt scheduled with Dr. Modesto Charon for 07/13/12 for hosp f/u.  He will bring discharge papers with him.

## 2012-07-13 ENCOUNTER — Ambulatory Visit (INDEPENDENT_AMBULATORY_CARE_PROVIDER_SITE_OTHER): Payer: Medicare Other | Admitting: Family Medicine

## 2012-07-13 ENCOUNTER — Encounter: Payer: Self-pay | Admitting: Family Medicine

## 2012-07-13 VITALS — BP 145/63 | HR 64 | Temp 97.0°F | Ht 67.0 in | Wt 161.2 lb

## 2012-07-13 DIAGNOSIS — E559 Vitamin D deficiency, unspecified: Secondary | ICD-10-CM | POA: Insufficient documentation

## 2012-07-13 DIAGNOSIS — Z952 Presence of prosthetic heart valve: Secondary | ICD-10-CM | POA: Insufficient documentation

## 2012-07-13 DIAGNOSIS — K219 Gastro-esophageal reflux disease without esophagitis: Secondary | ICD-10-CM

## 2012-07-13 DIAGNOSIS — K7689 Other specified diseases of liver: Secondary | ICD-10-CM

## 2012-07-13 DIAGNOSIS — I10 Essential (primary) hypertension: Secondary | ICD-10-CM

## 2012-07-13 DIAGNOSIS — M109 Gout, unspecified: Secondary | ICD-10-CM

## 2012-07-13 DIAGNOSIS — E1129 Type 2 diabetes mellitus with other diabetic kidney complication: Secondary | ICD-10-CM

## 2012-07-13 DIAGNOSIS — N138 Other obstructive and reflux uropathy: Secondary | ICD-10-CM

## 2012-07-13 DIAGNOSIS — N401 Enlarged prostate with lower urinary tract symptoms: Secondary | ICD-10-CM

## 2012-07-13 DIAGNOSIS — Z954 Presence of other heart-valve replacement: Secondary | ICD-10-CM

## 2012-07-13 DIAGNOSIS — I359 Nonrheumatic aortic valve disorder, unspecified: Secondary | ICD-10-CM

## 2012-07-13 DIAGNOSIS — E785 Hyperlipidemia, unspecified: Secondary | ICD-10-CM

## 2012-07-13 DIAGNOSIS — I2510 Atherosclerotic heart disease of native coronary artery without angina pectoris: Secondary | ICD-10-CM

## 2012-07-13 DIAGNOSIS — K7460 Unspecified cirrhosis of liver: Secondary | ICD-10-CM

## 2012-07-13 LAB — BASIC METABOLIC PANEL WITH GFR
BUN: 18 mg/dL (ref 6–23)
CO2: 26 mEq/L (ref 19–32)
Calcium: 8.8 mg/dL (ref 8.4–10.5)
Chloride: 105 mEq/L (ref 96–112)
Creat: 1.4 mg/dL — ABNORMAL HIGH (ref 0.50–1.35)
GFR, Est African American: 55 mL/min — ABNORMAL LOW
GFR, Est Non African American: 48 mL/min — ABNORMAL LOW
Glucose, Bld: 153 mg/dL — ABNORMAL HIGH (ref 70–99)
Potassium: 4.4 mEq/L (ref 3.5–5.3)
Sodium: 137 mEq/L (ref 135–145)

## 2012-07-13 LAB — HEPATIC FUNCTION PANEL
ALT: 8 U/L (ref 0–53)
AST: 25 U/L (ref 0–37)
Albumin: 2.9 g/dL — ABNORMAL LOW (ref 3.5–5.2)
Alkaline Phosphatase: 157 U/L — ABNORMAL HIGH (ref 39–117)
Bilirubin, Direct: 0.3 mg/dL (ref 0.0–0.3)
Indirect Bilirubin: 0.8 mg/dL (ref 0.0–0.9)
Total Bilirubin: 1.1 mg/dL (ref 0.3–1.2)
Total Protein: 7.5 g/dL (ref 6.0–8.3)

## 2012-07-13 LAB — POCT CBC
Granulocyte percent: 76.1 %G (ref 37–80)
HCT, POC: 27.9 % — AB (ref 43.5–53.7)
Hemoglobin: 9.1 g/dL — AB (ref 14.1–18.1)
Lymph, poc: 0.5 — AB (ref 0.6–3.4)
MCH, POC: 27.8 pg (ref 27–31.2)
MCHC: 32.7 g/dL (ref 31.8–35.4)
MCV: 85.1 fL (ref 80–97)
MPV: 6.2 fL (ref 0–99.8)
POC Granulocyte: 2.3 (ref 2–6.9)
POC LYMPH PERCENT: 16.8 %L (ref 10–50)
Platelet Count, POC: 66 10*3/uL — AB (ref 142–424)
RBC: 3.3 M/uL — AB (ref 4.69–6.13)
RDW, POC: 18.3 %
WBC: 3 10*3/uL — AB (ref 4.6–10.2)

## 2012-07-13 LAB — POCT GLYCOSYLATED HEMOGLOBIN (HGB A1C): Hemoglobin A1C: 5.5

## 2012-07-13 LAB — URIC ACID: Uric Acid, Serum: 7.8 mg/dL — ABNORMAL HIGH (ref 4.0–6.0)

## 2012-07-13 NOTE — Progress Notes (Signed)
Patient ID: Matthew Singh, male   DOB: 19-Sep-1932, 79 y.o.   MRN: [id number] SUBJECTIVE: HPI: S/P Ao Valve  Replacement at [facility] on 06/29/2012. Feels a whole lot better. No fever, no chills.  Patient is here for follow up of Diabetes Mellitus.Symptoms of DM:has had Nocturia 3 to 5 ,admits toUrinary Frequency ,denies Blurred vision ,deniesDizziness,denies.Dysuria,deniesparesthesias, deniesextremity pain or ulcers.Marland Kitchendenieschest pain. .has hadan annual eye exam. do check the feet. doescheck CBGs. Average CBG:__140______.Marland Kitchen deniesto episodes of hypoglycemia. doeshave an emergency hypoglycemic plan. admits toCompliance with medications. deniesProblems with medications. Other problems is  Stable and doing well.  PMH/PSH: reviewed/updated in Epic  SH/FH: reviewed/updated in Epic  Allergies: reviewed/updated in Epic  Medications: reviewed/updated in Epic  Immunizations: reviewed/updated in Epic  ROS: As above in the HPI. All other systems are stable or negative.  OBJECTIVE: APPEARANCE:  Patient in no acute distress.The patient appeared well nourished and normally developed. Acyanotic. Waist:42 inches VITAL SIGNS:BP 145/63  Pulse 64  Temp(Src) 97 F (36.1 C) (Oral)  Ht 5\' 7"  (1.702 m)  Wt 161 lb 3.2 oz (73.12 kg)  BMI 25.24 kg/m2   SKIN: warm and  Dry without overt rashes, tattoos and scars  HEAD and Neck: without JVD, Head and scalp: normal Eyes:No scleral icterus. Fundi normal, eye movements normal. Ears: Auricle normal, canal normal, Tympanic membranes normal, insufflation normal. Nose: normal Throat: normal Neck & thyroid: normal  CHEST & LUNGS: Chest wall: normal Lungs: Clear in apices  CVS: Reveals the PMI to be normally located. Regular rhythm, First and Second Heart sounds are normal,  absence of murmurs, rubs or gallops. Peripheral vasculature: Radial pulses: normal Dorsal pedis pulses: normal Posterior pulses: normal  ABDOMEN:   Appearance: normal Benign,, no organomegaly, no masses, no Abdominal Aortic enlargement. No Guarding , no rebound. No Bruits. Bowel sounds: normal  RECTAL: N/A GU: N/A  EXTREMETIES: nonedematous. Both Femoral and Pedal pulses are normal.  MUSCULOSKELETAL:  Spine: normal Joints: intact  NEUROLOGIC: oriented to time,place and person; nonfocal. Strength is normal Sensory is normal Reflexes are normal Cranial Nerves are normal.  ASSESSMENT: AORTIC STENOSIS  S/P aortic valve replacement - Plan: POCT CBC  CORONARY ARTERY DISEASE  Cirrhosis  FATTY LIVER DISEASE  GERD  GOUT - Plan: Uric acid  HYPERTENSION - Plan: BASIC METABOLIC PANEL WITH GFR  HYPERLIPIDEMIA-MIXED - Plan: Hepatic function panel, NMR Lipoprofile with Lipids  HYPERTROPHY PROSTATE W/UR OBST & OTH LUTS - Plan: BASIC METABOLIC PANEL WITH GFR  Type II or unspecified type diabetes mellitus with renal manifestations, not stated as uncontrolled - Plan: POCT glycosylated hemoglobin (Hb A1C), CANCELED: POCT UA - Microalbumin  Unspecified vitamin D deficiency - Plan: Vitamin D 25 hydroxy    PLAN: May 7th Cardiac rehab in [facility] May 9th is follow up at [facility]       Dr Woodroe Mode Recommendations  Diet and Exercise discussed with patient.  For nutrition information, I recommend books:  1).Eat to Live by Dr Monico Hoar. 2).Prevent and Reverse Heart Disease by Dr Suzzette Righter.  Exercise recommendations are:  If unable to walk, then the patient can exercise in a chair 3 times a day. By flapping arms like a bird gently and raising legs outwards to the front.  If ambulatory, the patient can go for walks for 30 minutes 3 times a week. Then increase the intensity and duration as tolerated.  Goal is to try to attain exercise frequency to 5 times a week.  If applicable: Best to perform resistance exercises (  machines or weights) 2 days a week and cardio type exercises 3 days per week.  Orders Placed  This Encounter  Procedures  . BASIC METABOLIC PANEL WITH GFR  . Hepatic function panel  . NMR Lipoprofile with Lipids  . Vitamin D 25 hydroxy  . Uric acid  . POCT CBC  . POCT glycosylated hemoglobin (Hb A1C)   Results for orders placed in visit on 07/13/12 (from the past 24 hour(s))  POCT CBC     Status: Abnormal   Collection Time    07/13/12  9:59 AM      Result Value Range   WBC 3.0 (*) 4.6 - 10.2 K/uL   Lymph, poc 0.5 (*) 0.6 - 3.4   POC LYMPH PERCENT 16.8  10 - 50 %L   POC Granulocyte 2.3  2 - 6.9   Granulocyte percent 76.1  37 - 80 %G   RBC 3.3 (*) 4.69 - 6.13 M/uL   Hemoglobin 9.1 (*) 14.1 - 18.1 g/dL   HCT, POC 57.8 (*) 46.9 - 53.7 %   MCV 85.1  80 - 97 fL   MCH, POC 27.8  27 - 31.2 pg   MCHC 32.7  31.8 - 35.4 g/dL   RDW, POC 62.9     Platelet Count, POC 66.0 (*) 142 - 424 K/uL   MPV 6.2  0 - 99.8 fL  POCT GLYCOSYLATED HEMOGLOBIN (HGB A1C)     Status: None   Collection Time    07/13/12 10:00 AM      Result Value Range   Hemoglobin A1C 5.5     Meds ordered this encounter  Medications  . DISCONTD: omeprazole (PRILOSEC) 40 MG capsule    Sig:   . aspirin 81 MG tablet    Sig: Take 81 mg by mouth daily.  . finasteride (PROSCAR) 5 MG tablet    Sig: Take 5 mg by mouth daily.  . clopidogrel (PLAVIX) 75 MG tablet    Sig: Take 75 mg by mouth daily.   RTC 3 months.  Baruch Lewers P. Modesto Charon, M.D.

## 2012-07-13 NOTE — Patient Instructions (Addendum)
      Dr Woodroe Mode Recommendations  Diet and Exercise discussed with patient.  For nutrition information, I recommend books:  1).Eat to Live by Dr Monico Hoar. 2).Prevent and Reverse Heart Disease by Dr Suzzette Righter.  Exercise recommendations are:  PLEASE ATTEND CARDIAC REHAB AND FOLLOW THEIR RECOMMENDATIONS. THESE ARE STANDARD TARGETS FOR MY PATIENTS If unable to walk, then the patient can exercise in a chair 3 times a day. By flapping arms like a bird gently and raising legs outwards to the front.  If ambulatory, the patient can go for walks for 30 minutes 3 times a week. Then increase the intensity and duration as tolerated.  Goal is to try to attain exercise frequency to 5 times a week.  If applicable: Best to perform resistance exercises (machines or weights) 2 days a week and cardio type exercises 3 days per week.

## 2012-07-14 LAB — VITAMIN D 25 HYDROXY (VIT D DEFICIENCY, FRACTURES): Vit D, 25-Hydroxy: 30 ng/mL (ref 30–89)

## 2012-07-15 NOTE — Progress Notes (Signed)
Quick Note:  Labs abnormal. Anemic HGb is low post op. Valve surgery Will need to recheck it in 1 week. Rest of the labs pending. Not yet ordered in Epic.  ______

## 2012-07-16 LAB — NMR LIPOPROFILE WITH LIPIDS
Cholesterol, Total: 90 mg/dL (ref <200)
HDL Particle Number: 13.8 umol/L — ABNORMAL LOW (ref >=30.5)
HDL Size: 10.3 nm (ref >=9.2)
HDL-C: 30 mg/dL — ABNORMAL LOW (ref >=40)
LDL (calc): 47 mg/dL (ref <100)
LDL Particle Number: 521 nmol/L (ref <1000)
LDL Size: 22 nm (ref >20.5)
LP-IR Score: 33 (ref <=45)
Large HDL-P: 7.4 umol/L (ref >=4.8)
Large VLDL-P: 2 nmol/L (ref <=2.7)
Small LDL Particle Number: <90 nmol/L (ref <=527)
Triglycerides: 67 mg/dL (ref <150)
VLDL Size: 56.5 nm — ABNORMAL HIGH (ref <=46.6)

## 2012-07-18 ENCOUNTER — Telehealth: Payer: Self-pay | Admitting: Family Medicine

## 2012-07-18 NOTE — Progress Notes (Signed)
Quick Note:  Lab result at goal. No change in Medications for now. No Change in plans and follow up. Remember to recheck the CBC/Hemoglobin in 1 week for postop anemia. FW ______

## 2012-07-18 NOTE — Telephone Encounter (Signed)
Yes patient does need to come in for labs because she are following up on post operative anemia and need to compare to past levels.

## 2012-07-31 ENCOUNTER — Encounter (HOSPITAL_COMMUNITY): Payer: Self-pay

## 2012-07-31 ENCOUNTER — Encounter (HOSPITAL_COMMUNITY)
Admission: RE | Admit: 2012-07-31 | Discharge: 2012-07-31 | Disposition: A | Discharge status: Home / Self Care | Payer: Medicare Other | Source: Ambulatory Visit | Attending: Family Medicine | Admitting: Family Medicine

## 2012-07-31 VITALS — BP 114/56 | HR 59 | Ht 66.0 in | Wt 161.0 lb

## 2012-07-31 DIAGNOSIS — Z954 Presence of other heart-valve replacement: Secondary | ICD-10-CM | POA: Insufficient documentation

## 2012-07-31 DIAGNOSIS — Z5189 Encounter for other specified aftercare: Secondary | ICD-10-CM | POA: Insufficient documentation

## 2012-07-31 NOTE — Patient Instructions (Signed)
Pt has finished orientation and is scheduled to start CR on 08/06/12 at 9:30. Pt has been instructed to arrive to class 15 minutes early for scheduled class. Pt has been instructed to wear comfortable clothing and shoes with rubber soles. Pt has been told to take their medications 1 hour prior to coming to class.  If the patient is not going to attend class, he/she has been instructed to call.

## 2012-07-31 NOTE — Progress Notes (Addendum)
Patient referred to [place] from Dr. Dion Body via [facility] due to AV Replaced v43.3. During orientation advised patient on arrival and appointment times what to wear, what to do before, during and after exercise. Reviewed attendance and class policy. Talked about inclement weather and class consultation policy. Pt is scheduled to start Cardiac Rehab on 08/06/12 at 9:30. Pt was advised to come to class 5 minutes before class starts. He was also given instructions on meeting with the dietician and attending the Family Structure classes. Pt is eager to get started. Patient was able to complete his 6 minute pre-walk test.

## 2012-08-06 ENCOUNTER — Encounter (HOSPITAL_COMMUNITY)
Admission: RE | Admit: 2012-08-06 | Discharge: 2012-08-06 | Disposition: A | Discharge status: Home / Self Care | Payer: Medicare Other | Source: Ambulatory Visit | Attending: *Deleted | Admitting: *Deleted

## 2012-08-08 ENCOUNTER — Telehealth: Payer: Self-pay | Admitting: Family Medicine

## 2012-08-08 ENCOUNTER — Other Ambulatory Visit (INDEPENDENT_AMBULATORY_CARE_PROVIDER_SITE_OTHER): Payer: Medicare Other

## 2012-08-08 ENCOUNTER — Encounter (HOSPITAL_COMMUNITY): Payer: Medicare Other

## 2012-08-08 DIAGNOSIS — R040 Epistaxis: Secondary | ICD-10-CM

## 2012-08-08 LAB — POCT CBC
Granulocyte percent: 72.9 %G (ref 37–80)
HCT, POC: 23.9 % — AB (ref 43.5–53.7)
Hemoglobin: 8.1 g/dL — AB (ref 14.1–18.1)
Lymph, poc: 0.3 — AB (ref 0.6–3.4)
MCH, POC: 29.4 pg (ref 27–31.2)
MCHC: 34.1 g/dL (ref 31.8–35.4)
MCV: 86.2 fL (ref 80–97)
MPV: 5.9 fL (ref 0–99.8)
POC Granulocyte: 1.2 — AB (ref 2–6.9)
POC LYMPH PERCENT: 21 %L (ref 10–50)
Platelet Count, POC: 47 10*3/uL — AB (ref 142–424)
RBC: 2.8 M/uL — AB (ref 4.69–6.13)
RDW, POC: 17.1 %
WBC: 1.6 10*3/uL — AB (ref 4.6–10.2)

## 2012-08-08 NOTE — Progress Notes (Signed)
Patient in today for labs only. °

## 2012-08-08 NOTE — Telephone Encounter (Signed)
Called to check on patient. He states that he has nosebleeds occasionally at home but has them almost every time he attends cardiac rehab. States that he wants labs done and his son called [facility] this am and they told him to come here for labs. Patient already has an appt at 2pm for lab

## 2012-08-10 ENCOUNTER — Encounter (HOSPITAL_COMMUNITY)
Admission: RE | Admit: 2012-08-10 | Discharge: 2012-08-10 | Disposition: A | Discharge status: Home / Self Care | Payer: Medicare Other | Source: Ambulatory Visit | Attending: *Deleted | Admitting: *Deleted

## 2012-08-11 NOTE — Progress Notes (Signed)
Quick Note:  Labs abnormal.but Okay and stable at this point. No changes. ______

## 2012-08-13 ENCOUNTER — Encounter (HOSPITAL_COMMUNITY): Payer: Medicare Other

## 2012-08-15 ENCOUNTER — Encounter (HOSPITAL_COMMUNITY)
Admission: RE | Admit: 2012-08-15 | Discharge: 2012-08-15 | Disposition: A | Discharge status: Home / Self Care | Payer: Medicare Other | Source: Ambulatory Visit | Attending: *Deleted | Admitting: *Deleted

## 2012-08-17 ENCOUNTER — Encounter (HOSPITAL_COMMUNITY)
Admission: RE | Admit: 2012-08-17 | Discharge: 2012-08-17 | Disposition: A | Discharge status: Home / Self Care | Payer: Medicare Other | Source: Ambulatory Visit | Attending: *Deleted | Admitting: *Deleted

## 2012-08-20 ENCOUNTER — Encounter (HOSPITAL_COMMUNITY): Payer: Medicare Other

## 2012-08-22 ENCOUNTER — Encounter (HOSPITAL_COMMUNITY)
Admission: RE | Admit: 2012-08-22 | Discharge: 2012-08-22 | Disposition: A | Discharge status: Home / Self Care | Payer: Medicare Other | Source: Ambulatory Visit | Attending: Family Medicine | Admitting: Family Medicine

## 2012-08-22 ENCOUNTER — Telehealth: Payer: Self-pay | Admitting: *Deleted

## 2012-08-22 ENCOUNTER — Other Ambulatory Visit: Payer: Self-pay | Admitting: *Deleted

## 2012-08-22 DIAGNOSIS — Z954 Presence of other heart-valve replacement: Secondary | ICD-10-CM | POA: Insufficient documentation

## 2012-08-22 DIAGNOSIS — Z5189 Encounter for other specified aftercare: Secondary | ICD-10-CM | POA: Insufficient documentation

## 2012-08-22 NOTE — Telephone Encounter (Signed)
Pt's son called stating that he had lab work done at his PCP and he needs to see his hematologist.  Informed Dr Donnald Garre, he advised that pt have f/u appt in 1-2 weeks.  Also asked for pt to have lab work faxed to Dr [facility] office.  SLJ

## 2012-08-23 ENCOUNTER — Telehealth: Payer: Self-pay | Admitting: Internal Medicine

## 2012-08-23 ENCOUNTER — Other Ambulatory Visit: Payer: Self-pay | Admitting: Medical Oncology

## 2012-08-23 DIAGNOSIS — D696 Thrombocytopenia, unspecified: Secondary | ICD-10-CM

## 2012-08-23 NOTE — Telephone Encounter (Signed)
lvm for pt regarding to 6.19.14 appt...mailed pt appt sched and letter for June...returned Diane Coad's call "manager from cardiac rehab" and advised her of his nxt appt and she will contact pt son and advise him.

## 2012-08-24 ENCOUNTER — Telehealth: Payer: Self-pay | Admitting: Medical Oncology

## 2012-08-24 ENCOUNTER — Encounter (HOSPITAL_COMMUNITY): Payer: Medicare Other

## 2012-08-24 NOTE — Telephone Encounter (Signed)
Patient said he will call back to r/s appts depending on what doctors at [facility] say about his blood work.

## 2012-08-27 ENCOUNTER — Other Ambulatory Visit: Payer: Self-pay | Admitting: *Deleted

## 2012-08-27 ENCOUNTER — Encounter (HOSPITAL_COMMUNITY)
Admission: RE | Admit: 2012-08-27 | Discharge: 2012-08-27 | Disposition: A | Discharge status: Home / Self Care | Payer: Medicare Other | Source: Ambulatory Visit | Attending: *Deleted | Admitting: *Deleted

## 2012-08-27 MED ORDER — ALLOPURINOL 300 MG PO TABS: ORAL_TABLET | ORAL | Status: DC

## 2012-08-29 ENCOUNTER — Encounter (HOSPITAL_COMMUNITY)
Admission: RE | Admit: 2012-08-29 | Discharge: 2012-08-29 | Disposition: A | Discharge status: Home / Self Care | Payer: Medicare Other | Source: Ambulatory Visit | Attending: *Deleted | Admitting: *Deleted

## 2012-08-31 ENCOUNTER — Encounter (HOSPITAL_COMMUNITY): Payer: Medicare Other

## 2012-08-31 DIAGNOSIS — R001 Bradycardia, unspecified: Secondary | ICD-10-CM | POA: Insufficient documentation

## 2012-09-03 ENCOUNTER — Encounter (HOSPITAL_COMMUNITY): Payer: Medicare Other

## 2012-09-05 ENCOUNTER — Encounter (HOSPITAL_COMMUNITY): Payer: Medicare Other

## 2012-09-06 ENCOUNTER — Other Ambulatory Visit: Payer: Medicare Other | Admitting: Lab

## 2012-09-06 ENCOUNTER — Ambulatory Visit: Payer: Medicare Other | Admitting: Internal Medicine

## 2012-09-07 ENCOUNTER — Encounter (HOSPITAL_COMMUNITY): Payer: Medicare Other

## 2012-09-10 ENCOUNTER — Encounter (HOSPITAL_COMMUNITY): Payer: Medicare Other

## 2012-09-12 ENCOUNTER — Encounter (INDEPENDENT_AMBULATORY_CARE_PROVIDER_SITE_OTHER): Payer: Self-pay | Admitting: *Deleted

## 2012-09-12 ENCOUNTER — Other Ambulatory Visit (INDEPENDENT_AMBULATORY_CARE_PROVIDER_SITE_OTHER): Payer: Self-pay | Admitting: *Deleted

## 2012-09-12 ENCOUNTER — Encounter (HOSPITAL_COMMUNITY): Payer: Medicare Other

## 2012-09-12 DIAGNOSIS — K7460 Unspecified cirrhosis of liver: Secondary | ICD-10-CM

## 2012-09-14 ENCOUNTER — Encounter (HOSPITAL_COMMUNITY): Payer: Medicare Other

## 2012-09-17 ENCOUNTER — Encounter (HOSPITAL_COMMUNITY): Payer: Medicare Other

## 2012-09-19 ENCOUNTER — Encounter (HOSPITAL_COMMUNITY): Payer: Medicare Other

## 2012-09-21 ENCOUNTER — Encounter (HOSPITAL_COMMUNITY): Payer: Medicare Other

## 2012-09-24 ENCOUNTER — Encounter (HOSPITAL_COMMUNITY): Payer: Medicare Other

## 2012-09-26 ENCOUNTER — Encounter (HOSPITAL_COMMUNITY): Payer: Medicare Other

## 2012-09-28 ENCOUNTER — Encounter (HOSPITAL_COMMUNITY): Payer: Medicare Other

## 2012-10-01 ENCOUNTER — Encounter (HOSPITAL_COMMUNITY): Payer: Medicare Other

## 2012-10-03 ENCOUNTER — Encounter (HOSPITAL_COMMUNITY): Payer: Medicare Other

## 2012-10-05 ENCOUNTER — Encounter (HOSPITAL_COMMUNITY): Payer: Medicare Other

## 2012-10-08 ENCOUNTER — Encounter (HOSPITAL_COMMUNITY): Payer: Medicare Other

## 2012-10-10 ENCOUNTER — Encounter (HOSPITAL_COMMUNITY): Payer: Medicare Other

## 2012-10-12 ENCOUNTER — Encounter (HOSPITAL_COMMUNITY): Payer: Medicare Other

## 2012-10-15 ENCOUNTER — Encounter (HOSPITAL_COMMUNITY): Payer: Medicare Other

## 2012-10-16 ENCOUNTER — Ambulatory Visit: Payer: Medicare Other | Admitting: Family Medicine

## 2012-10-17 ENCOUNTER — Encounter (HOSPITAL_COMMUNITY): Payer: Medicare Other

## 2012-10-19 ENCOUNTER — Encounter (HOSPITAL_COMMUNITY): Payer: Medicare Other

## 2012-10-22 ENCOUNTER — Encounter (HOSPITAL_COMMUNITY): Payer: Medicare Other

## 2012-10-24 ENCOUNTER — Encounter (HOSPITAL_COMMUNITY): Payer: Medicare Other

## 2012-10-26 ENCOUNTER — Encounter (HOSPITAL_COMMUNITY): Payer: Medicare Other

## 2012-11-06 ENCOUNTER — Other Ambulatory Visit: Payer: Self-pay

## 2012-11-06 MED ORDER — BROMOCRIPTINE MESYLATE 2.5 MG PO TABS: 2.5000 mg | ORAL_TABLET | Freq: Two times a day (BID) | ORAL | Status: DC

## 2012-12-20 ENCOUNTER — Telehealth: Payer: Self-pay | Admitting: Family Medicine

## 2012-12-25 ENCOUNTER — Other Ambulatory Visit: Payer: Self-pay | Admitting: Family Medicine

## 2012-12-25 MED ORDER — ONETOUCH LANCETS MISC: 1.0000 [drp] | Freq: Four times a day (QID) | Status: AC

## 2012-12-25 MED ORDER — GLUCOSE BLOOD VI STRP: ORAL_STRIP | Status: AC

## 2012-12-25 NOTE — Telephone Encounter (Signed)
Prescription renewed in EPIC. 

## 2012-12-27 DIAGNOSIS — D619 Aplastic anemia, unspecified: Secondary | ICD-10-CM | POA: Insufficient documentation

## 2013-02-15 ENCOUNTER — Telehealth: Payer: Self-pay | Admitting: Family Medicine

## 2013-02-15 NOTE — Telephone Encounter (Signed)
error 

## 2013-02-18 ENCOUNTER — Other Ambulatory Visit: Payer: Self-pay

## 2013-02-18 ENCOUNTER — Other Ambulatory Visit: Payer: Self-pay | Admitting: General Practice

## 2013-02-18 DIAGNOSIS — E785 Hyperlipidemia, unspecified: Secondary | ICD-10-CM

## 2013-02-18 MED ORDER — PRAVASTATIN SODIUM 80 MG PO TABS: ORAL_TABLET | ORAL | Status: DC

## 2013-02-18 NOTE — Telephone Encounter (Signed)
Last seen 07/13/12  FPW  Last glucose 07/13/12    If approved print for mail and route to nurse

## 2013-02-18 NOTE — Telephone Encounter (Signed)
Last seen 01/07/13  Matthew Singh  Last lipid 01/07/13  Epic has 1/2 daily and the pharmacy sent over as qd ???  If approved print for mail order and route to nurse

## 2013-02-19 ENCOUNTER — Telehealth: Payer: Self-pay | Admitting: Family Medicine

## 2013-02-19 NOTE — Telephone Encounter (Signed)
Rx ready for pick up. 

## 2013-02-19 NOTE — Addendum Note (Signed)
Encounter addended by: Angelica Pou, RN on: 02/19/2013  9:03 AM<BR>     Documentation filed: Clinical Notes

## 2013-02-19 NOTE — Telephone Encounter (Signed)
Spoke with Dr Modesto Charon as rx for pravastatin not at front desk for pick up , but he thought he may have sent it to mail order Pt will call his mail order pharmacy and will let us know if did not get the rx.

## 2013-02-19 NOTE — Progress Notes (Signed)
Cardiac Rehabilitation Program Outcomes Report   Orientation:  07/30/2012 Graduate Date:  NA Discharge Date: 08/29/2012 # of sessions completed: 7 [id number] Valve replaced  Cardiologist: Andee Lineman Family MD:  Dr. Orvan July Class Time:  09:30  A.  Exercise Program:  Tolerates exercise @ 1.77 METS for 15 minutes, Walk Test Results:  Pre: Pre Walk Test: Resting HR 59, BP 114/56, O2 100%, RPE 6, and RPD  6, 6 min HR 130, BP 156/60, O2 98%, RPE 12, and RPD 11. Post HR 62, BP 138/52, O2 100%, RPE 8, and RPD 6. Walked 1300 ft at 2.5 mph at 2.9 METS. and Discharged  B.  Mental Health:  Good mental attitude  C.  Education/Instruction/Skills  Accurately checks own pulse.  Rest:  64  Exercise:  113 , Knows THR for exercise, Uses Perceived Exertion Scale and/or Dyspnea Scale and Attended 2 education classes  Uses Perceived Exertion Scale and/or Dyspnea Scale  D.  Nutrition/Weight Control/Body Composition:  Adherence to prescribed nutrition program: good    E.  Blood Lipids    Lab Results  Component Value Date   CHOL 115 04/26/2011   HDL 38.20* 04/26/2011   LDLCALC 47 07/13/2012   TRIG 67 07/13/2012   CHOLHDL 3 04/26/2011    F.  Lifestyle Changes:  Making positive lifestyle changes and Not smoking:  Quit 1974  G.  Symptoms noted with exercise:  Asymptomatic  Report Completed By:  Lelon Huh. Arabel Barcenas RN   Comments:  This is patients discharge report. He attended only 7 sessions. He achieved a peak METS of 1.77. His resting HR was 64 and resting BP was 132?62 and his peak HR was 113 and peak BP 142/60. He did well while coming. He did not finish program.

## 2013-02-19 NOTE — Addendum Note (Signed)
Encounter addended by: Angelica Pou, RN on: 02/19/2013  9:01 AM<BR>     Documentation filed: Notes Section

## 2013-02-19 NOTE — Telephone Encounter (Signed)
Left message to call back regarding pravstatin rx

## 2013-02-21 ENCOUNTER — Other Ambulatory Visit: Payer: Self-pay | Admitting: *Deleted

## 2013-02-21 MED ORDER — METFORMIN HCL ER 500 MG PO TB24: 500.0000 mg | ORAL_TABLET | Freq: Every day | ORAL | Status: DC

## 2013-03-03 ENCOUNTER — Other Ambulatory Visit: Payer: Self-pay | Admitting: Family Medicine

## 2013-03-03 MED ORDER — OMEPRAZOLE MAGNESIUM 20 MG PO TBEC: 20.0000 mg | DELAYED_RELEASE_TABLET | Freq: Every day | ORAL | Status: DC

## 2013-03-03 NOTE — Telephone Encounter (Signed)
Rx Refilled for 90 day supply 

## 2013-03-04 ENCOUNTER — Telehealth: Payer: Self-pay | Admitting: Family Medicine

## 2013-03-05 ENCOUNTER — Other Ambulatory Visit: Payer: Self-pay

## 2013-03-05 DIAGNOSIS — E785 Hyperlipidemia, unspecified: Secondary | ICD-10-CM

## 2013-03-05 NOTE — Telephone Encounter (Signed)
Patient needs to be seen. Patient has exceeded limit since last visit. Refill denied. Bring all medications at next office visit. 

## 2013-03-05 NOTE — Telephone Encounter (Signed)
Last seen 07/13/12  FPW   This med not on Epic List of meds   If approved print for mail order and route to nurse

## 2013-03-05 NOTE — Telephone Encounter (Signed)
Last seen 07/13/12  FPW  If approved for mail order print and route to nurse

## 2013-03-06 ENCOUNTER — Other Ambulatory Visit: Payer: Self-pay

## 2013-03-06 DIAGNOSIS — E785 Hyperlipidemia, unspecified: Secondary | ICD-10-CM

## 2013-03-06 NOTE — Telephone Encounter (Signed)
Last seen 07/13/12  FPW  Last labs 08/23/12  If approved print for mail order and route to nurse

## 2013-03-07 MED ORDER — PRAVASTATIN SODIUM 80 MG PO TABS: ORAL_TABLET | ORAL | Status: DC

## 2013-03-07 MED ORDER — METFORMIN HCL ER 500 MG PO TB24: 500.0000 mg | ORAL_TABLET | Freq: Every day | ORAL | Status: DC

## 2013-03-07 MED ORDER — OMEPRAZOLE MAGNESIUM 20 MG PO TBEC: 20.0000 mg | DELAYED_RELEASE_TABLET | Freq: Every day | ORAL | Status: DC

## 2013-03-07 MED ORDER — BROMOCRIPTINE MESYLATE 2.5 MG PO TABS: 2.5000 mg | ORAL_TABLET | Freq: Two times a day (BID) | ORAL | Status: AC

## 2013-03-07 NOTE — Telephone Encounter (Signed)
Patient needs to be seen. Has exceeded time since last visit. Bromocriptine Rx by specialist Limited quantity refilled. Needs to bring all medications to next appointment.

## 2013-03-11 NOTE — Telephone Encounter (Signed)
Pt notified rx ready to pickup. Pt said he spoke with Dr. Maurice March nurse last week and she told him she would fax them in for him. Advised pt to call pharmacy and make sure they received rx.

## 2013-04-09 ENCOUNTER — Other Ambulatory Visit (INDEPENDENT_AMBULATORY_CARE_PROVIDER_SITE_OTHER): Payer: Medicare Other

## 2013-04-09 ENCOUNTER — Encounter (INDEPENDENT_AMBULATORY_CARE_PROVIDER_SITE_OTHER): Payer: Self-pay

## 2013-04-09 DIAGNOSIS — D696 Thrombocytopenia, unspecified: Secondary | ICD-10-CM

## 2013-04-09 NOTE — Progress Notes (Signed)
Pt came in for labs for dr .Linus Orn

## 2013-04-10 ENCOUNTER — Telehealth: Payer: Self-pay | Admitting: *Deleted

## 2013-04-10 DIAGNOSIS — D696 Thrombocytopenia, unspecified: Secondary | ICD-10-CM

## 2013-04-10 LAB — CBC WITH DIFFERENTIAL
Basophils Absolute: 0 10*3/uL (ref 0.0–0.2)
Basos: 0 %
Eos: 2 %
Eosinophils Absolute: 0.1 10*3/uL (ref 0.0–0.4)
HCT: 34 % — ABNORMAL LOW (ref 37.5–51.0)
Hemoglobin: 11.8 g/dL — ABNORMAL LOW (ref 12.6–17.7)
Immature Grans (Abs): 0 10*3/uL (ref 0.0–0.1)
Immature Granulocytes: 0 %
Lymphocytes Absolute: 0.5 10*3/uL — ABNORMAL LOW (ref 0.7–3.1)
Lymphs: 13 %
MCH: 31.9 pg (ref 26.6–33.0)
MCHC: 34.7 g/dL (ref 31.5–35.7)
MCV: 92 fL (ref 79–97)
Monocytes Absolute: 0.2 10*3/uL (ref 0.1–0.9)
Monocytes: 6 %
Neutrophils Absolute: 2.8 10*3/uL (ref 1.4–7.0)
Neutrophils Relative %: 79 %
Platelets: 46 10*3/uL — CL (ref 150–379)
RBC: 3.7 x10E6/uL — ABNORMAL LOW (ref 4.14–5.80)
RDW: 15.4 % (ref 12.3–15.4)
WBC: 3.6 10*3/uL (ref 3.4–10.8)

## 2013-04-10 NOTE — Progress Notes (Signed)
Quick Note:  Call Patient Labs that are abnormal: Platelets lower than before Recommendations: He needs to see his hematologist at [facility].    ______

## 2013-04-12 ENCOUNTER — Telehealth: Payer: Self-pay | Admitting: Family Medicine

## 2013-04-12 NOTE — Telephone Encounter (Signed)
Unable to reach patient by phone after multiple attempts.  Detailed message left on home voicemail. Referral placed to [facility] hematology.

## 2013-04-15 NOTE — Telephone Encounter (Signed)
Left message for pt to return call.

## 2013-04-15 NOTE — Telephone Encounter (Signed)
Spoke with pt labs discussed and call transferred to referrals to inquire about status of referral . No further questions asked.

## 2013-04-23 ENCOUNTER — Telehealth: Payer: Self-pay | Admitting: Family Medicine

## 2013-04-23 ENCOUNTER — Other Ambulatory Visit: Payer: Self-pay

## 2013-04-23 NOTE — Telephone Encounter (Signed)
Last seen 07/13/12  This is for mail order  FPW

## 2013-04-23 NOTE — Telephone Encounter (Signed)
Last seen 4/14 FPW Last glucose 08/23/12  This is mail order

## 2013-04-23 NOTE — Telephone Encounter (Signed)
FYI

## 2013-04-24 NOTE — Telephone Encounter (Signed)
Patient needs to be seen. Patient has exceeded limit since last visit. Refill denied. Bring all medications at next office visit. 

## 2013-04-29 ENCOUNTER — Other Ambulatory Visit: Payer: Self-pay

## 2013-04-29 NOTE — Telephone Encounter (Signed)
Last seen 07/13/12 FPW  Last glucose 08/23/12

## 2013-05-02 ENCOUNTER — Other Ambulatory Visit: Payer: Self-pay | Admitting: Family Medicine

## 2013-05-02 ENCOUNTER — Ambulatory Visit (HOSPITAL_COMMUNITY): Payer: Medicare Other

## 2013-05-02 ENCOUNTER — Telehealth: Payer: Self-pay | Admitting: Family Medicine

## 2013-05-02 MED ORDER — METFORMIN HCL ER 500 MG PO TB24: 500.0000 mg | ORAL_TABLET | Freq: Every day | ORAL | Status: DC

## 2013-05-02 NOTE — Telephone Encounter (Signed)
Call patient : Prescription refilled for metformin & sent to pharmacy in [place].for mail order Needs to get the bromocriptine from the specialists who Rx it.

## 2013-05-06 ENCOUNTER — Other Ambulatory Visit: Payer: Self-pay

## 2013-05-06 DIAGNOSIS — E785 Hyperlipidemia, unspecified: Secondary | ICD-10-CM

## 2013-05-06 MED ORDER — TAMSULOSIN HCL 0.4 MG PO CAPS: 0.4000 mg | ORAL_CAPSULE | Freq: Every day | ORAL | Status: DC

## 2013-05-06 NOTE — Telephone Encounter (Signed)
Last seen 4/14  FPW

## 2013-05-06 NOTE — Telephone Encounter (Signed)
Last seen and last lipid 07/13/12  FPW

## 2013-05-06 NOTE — Telephone Encounter (Signed)
Patient needs to be seen. Has exceeded time since last visit. Limited quantity refilled. Needs to bring all medications to next appointment.   

## 2013-05-06 NOTE — Telephone Encounter (Signed)
Last seen 4/14  FPW 

## 2013-05-06 NOTE — Telephone Encounter (Signed)
Last seen 07/13/13  FPW

## 2013-07-12 ENCOUNTER — Ambulatory Visit (INDEPENDENT_AMBULATORY_CARE_PROVIDER_SITE_OTHER): Payer: Medicare Other | Admitting: Family Medicine

## 2013-07-12 ENCOUNTER — Encounter: Payer: Self-pay | Admitting: Family Medicine

## 2013-07-12 VITALS — BP 138/64 | HR 68 | Temp 97.1°F | Ht 67.0 in | Wt 174.6 lb

## 2013-07-12 DIAGNOSIS — H109 Unspecified conjunctivitis: Secondary | ICD-10-CM

## 2013-07-12 DIAGNOSIS — J069 Acute upper respiratory infection, unspecified: Secondary | ICD-10-CM

## 2013-07-12 MED ORDER — AZITHROMYCIN 250 MG PO TABS: ORAL_TABLET | ORAL | Status: DC

## 2013-07-12 MED ORDER — CIPROFLOXACIN HCL 0.3 % OP SOLN: 2.0000 [drp] | OPHTHALMIC | Status: DC

## 2013-07-12 MED ORDER — METHYLPREDNISOLONE ACETATE 80 MG/ML IJ SUSP
60.0000 mg | Freq: Once | INTRAMUSCULAR | Status: AC
  Administered 2013-07-12: 60 mg via INTRAMUSCULAR

## 2013-07-12 NOTE — Progress Notes (Signed)
   Subjective:    Patient ID: Matthew Singh, male    DOB: [birth year], 80 y.o.   MRN: [id number]  HPI This 80 y.o. male presents for evaluation of URI sx's and left eye swelling and infection.   Review of Systems No chest pain, SOB, HA, dizziness, vision change, N/V, diarrhea, constipation, dysuria, urinary urgency or frequency, myalgias, arthralgias or rash.     Objective:   Physical Exam  Vital signs noted  Well developed well nourished male.  HEENT - Head atraumatic Normocephalic                Eyes - PERRLA, Conjuctiva - OD with injection and periocular swelling OS clear                Ears - EAC's Wnl TM's Wnl Gross Hearing WNL                Throat - oropharanx injected Respiratory - Lungs CTA bilateral Cardiac - RRR S1 and S2 without murmur GI - Abdomen soft Nontender and bowel sounds active x 4 Extremities - No edema. Neuro - Grossly intact.      Assessment & Plan:  Conjunctivitis - Plan: methylPREDNISolone acetate (DEPO-MEDROL) injection 60 mg, ciprofloxacin (CILOXAN) 0.3 % ophthalmic solution  URI (upper respiratory infection) - Plan: azithromycin (ZITHROMAX) 250 MG tablet Push po fluids, rest, tylenol and motrin otc prn as directed for fever, arthralgias, and myalgias.  Follow up prn if sx's continue or persist.  [name] FNP

## 2013-08-01 ENCOUNTER — Other Ambulatory Visit: Payer: Self-pay

## 2013-08-01 MED ORDER — ALLOPURINOL 100 MG PO TABS: 100.0000 mg | ORAL_TABLET | ORAL | Status: DC | PRN

## 2013-08-06 ENCOUNTER — Telehealth: Payer: Self-pay | Admitting: Family Medicine

## 2013-08-26 ENCOUNTER — Encounter: Payer: Self-pay | Admitting: Family Medicine

## 2013-08-26 ENCOUNTER — Ambulatory Visit (INDEPENDENT_AMBULATORY_CARE_PROVIDER_SITE_OTHER): Payer: Medicare Other | Admitting: Family Medicine

## 2013-08-26 VITALS — BP 137/64 | HR 77 | Temp 99.7°F | Ht 67.0 in | Wt 170.0 lb

## 2013-08-26 DIAGNOSIS — J069 Acute upper respiratory infection, unspecified: Secondary | ICD-10-CM

## 2013-08-26 MED ORDER — AZITHROMYCIN 250 MG PO TABS: ORAL_TABLET | ORAL | Status: DC

## 2013-08-26 NOTE — Progress Notes (Signed)
   Subjective:    Patient ID: Matthew Singh, male    DOB: [birth year], 80 y.o.   MRN: [id number]  HPI This 80 y.o. male presents for evaluation of uri sx's and fatigue.  He has hx of thrombocytopenia and Sees heme/onc.  He was recently seen and had labs which he states were ok according to heme/onc.   Review of Systems C/o uri sx's No chest pain, SOB, HA, dizziness, vision change, N/V, diarrhea, constipation, dysuria, urinary urgency or frequency, myalgias, arthralgias or rash.     Objective:   Physical Exam Vital signs noted  Well developed well nourished male.  HEENT - Head atraumatic Normocephalic                Eyes - PERRLA, Conjuctiva - clear Sclera- Clear EOMI                Ears - EAC's Wnl TM's Wnl Gross Hearing WNL                 Throat - oropharanx wnl Respiratory - Lungs CTA bilateral Cardiac - RRR S1 and S2 without murmur GI - Abdomen soft Nontender and bowel sounds active x 4 Extremities - No edema. Neuro - Grossly intact.       Assessment & Plan:  URI (upper respiratory infection) - Plan: azithromycin (ZITHROMAX) 250 MG tablet Push po fluids, rest, tylenol and motrin otc prn as directed for fever, arthralgias, and myalgias.  Follow up prn if sx's continue or persist.  [name] FNP

## 2013-10-15 ENCOUNTER — Telehealth: Payer: Self-pay | Admitting: Family Medicine

## 2013-10-15 ENCOUNTER — Other Ambulatory Visit: Payer: Self-pay | Admitting: Endocrinology

## 2013-10-15 NOTE — Telephone Encounter (Signed)
Please advise 

## 2013-10-16 ENCOUNTER — Other Ambulatory Visit: Payer: Self-pay | Admitting: Family Medicine

## 2013-10-16 ENCOUNTER — Other Ambulatory Visit: Payer: Self-pay | Admitting: *Deleted

## 2013-10-16 DIAGNOSIS — J069 Acute upper respiratory infection, unspecified: Secondary | ICD-10-CM

## 2013-10-16 MED ORDER — FERROUS SULFATE 325 (65 FE) MG PO TABS: 325.0000 mg | ORAL_TABLET | Freq: Two times a day (BID) | ORAL | Status: DC

## 2013-10-16 MED ORDER — AZITHROMYCIN 250 MG PO TABS: ORAL_TABLET | ORAL | Status: DC

## 2013-10-16 NOTE — Telephone Encounter (Signed)
zpak sent to his pharm

## 2013-10-17 NOTE — Telephone Encounter (Signed)
Pt notified. Rx called to pharmacy. 

## 2013-11-11 ENCOUNTER — Telehealth: Payer: Self-pay | Admitting: Family Medicine

## 2013-11-15 ENCOUNTER — Other Ambulatory Visit: Payer: Self-pay

## 2013-11-15 MED ORDER — METFORMIN HCL ER 500 MG PO TB24: 500.0000 mg | ORAL_TABLET | Freq: Every day | ORAL | Status: DC

## 2013-11-15 NOTE — Telephone Encounter (Signed)
Patient out of Metformin last seen 07/12/12  B [place]  Last glucose 08/23/12

## 2013-11-15 NOTE — Telephone Encounter (Signed)
Last seen 07/12/13  B [place]  Last glucose 09/02/12  This is mail order

## 2013-11-18 ENCOUNTER — Ambulatory Visit (INDEPENDENT_AMBULATORY_CARE_PROVIDER_SITE_OTHER): Payer: Medicare Other | Admitting: Family Medicine

## 2013-11-18 ENCOUNTER — Telehealth: Payer: Self-pay | Admitting: Family Medicine

## 2013-11-18 ENCOUNTER — Encounter: Payer: Self-pay | Admitting: Family Medicine

## 2013-11-18 VITALS — BP 138/68 | HR 76 | Temp 97.1°F | Ht 67.0 in | Wt 174.0 lb

## 2013-11-18 DIAGNOSIS — K7460 Unspecified cirrhosis of liver: Secondary | ICD-10-CM

## 2013-11-18 DIAGNOSIS — R5383 Other fatigue: Secondary | ICD-10-CM

## 2013-11-18 DIAGNOSIS — E118 Type 2 diabetes mellitus with unspecified complications: Principal | ICD-10-CM

## 2013-11-18 DIAGNOSIS — R197 Diarrhea, unspecified: Secondary | ICD-10-CM

## 2013-11-18 DIAGNOSIS — G8929 Other chronic pain: Secondary | ICD-10-CM

## 2013-11-18 DIAGNOSIS — R319 Hematuria, unspecified: Secondary | ICD-10-CM

## 2013-11-18 DIAGNOSIS — R5381 Other malaise: Secondary | ICD-10-CM

## 2013-11-18 DIAGNOSIS — D696 Thrombocytopenia, unspecified: Secondary | ICD-10-CM

## 2013-11-18 DIAGNOSIS — IMO0002 Reserved for concepts with insufficient information to code with codable children: Secondary | ICD-10-CM

## 2013-11-18 DIAGNOSIS — R1011 Right upper quadrant pain: Secondary | ICD-10-CM

## 2013-11-18 DIAGNOSIS — K7469 Other cirrhosis of liver: Secondary | ICD-10-CM

## 2013-11-18 DIAGNOSIS — E1165 Type 2 diabetes mellitus with hyperglycemia: Secondary | ICD-10-CM

## 2013-11-18 LAB — POCT URINALYSIS DIPSTICK
Bilirubin, UA: NEGATIVE
Glucose, UA: NEGATIVE
Ketones, UA: NEGATIVE
Leukocytes, UA: NEGATIVE
Nitrite, UA: NEGATIVE
Spec Grav, UA: 1.015
Urobilinogen, UA: NEGATIVE
pH, UA: 6

## 2013-11-18 LAB — POCT GLYCOSYLATED HEMOGLOBIN (HGB A1C): Hemoglobin A1C: 6.1

## 2013-11-18 LAB — POCT UA - MICROSCOPIC ONLY
Bacteria, U Microscopic: NEGATIVE
Casts, Ur, LPF, POC: NEGATIVE
Crystals, Ur, HPF, POC: NEGATIVE
Yeast, UA: NEGATIVE

## 2013-11-18 MED ORDER — CHOLESTYRAMINE LIGHT 4 G PO PACK: 4.0000 g | PACK | Freq: Two times a day (BID) | ORAL | Status: DC

## 2013-11-18 NOTE — Progress Notes (Signed)
   Subjective:    Patient ID: Matthew Singh, male    DOB: 05/26/1932, 80 y.o.   MRN: [id number]  HPI This 80 y.o. male presents for evaluation of diarrhea, abdominal pain, and joint pain. He has had diarrhea since having his GB taken out a year ago. He feels weak.  He has chronic medical conditions such as cirrhosis, thrombocytopenia, DM, CAD, hypertension, BPH, gout, and Hypertension.  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  He has hx of anemia and this is being tx'd with iron sulfate.  He states he has had some constipation problems due to the iron in the past but for the last 2 weeks he has had diarrhea.  He points to his right upper quadrant and states his pain is there.  He has recently seen cardiology for routine visit and has hx of CAD.   Review of Systems C/o abdominal discomfort, diarrhea, and fatigue.   No chest pain, SOB, HA, dizziness, vision change, N/V, constipation, dysuria, urinary urgency or frequency, myalgias, arthralgias or rash.  Objective:   Physical Exam Vital signs noted  Well developed well nourished male.  HEENT - Head atraumatic Normocephalic                Eyes - PERRLA, Conjuctiva - clear Sclera- Clear EOMI                Ears - EAC's Wnl TM's Wnl Gross Hearing WNL                Nose - Nares patent                 Throat - oropharanx wnl Respiratory - Lungs CTA bilateral Cardiac - RRR S1 and S2 without murmur GI - Abdomen soft Nontender and bowel sounds active x 4 Extremities - No edema. Neuro - Grossly intact.       Assessment & Plan:  Type II or unspecified type diabetes mellitus with unspecified complication, uncontrolled - Plan: POCT glycosylated hemoglobin (Hb A1C)  Abdominal pain, chronic, right upper quadrant - Plan: POCT CBC, DG Abd 1 View, POCT urinalysis dipstick, POCT UA - Microscopic Only  Diarrhea - Plan: Clostridium Difficile by PCR, CANCELED: Clostridium difficile EIA- Questran 4gm po bid.  Diarrhea does not  sound infectious and doubt cdiff but will check.  Diarrhea sounds like s/p cholycystectomy IBS.  Other malaise and fatigue - Plan: Vitamin B12, TSH, CMP14+EGFR, POCT urinalysis dipstick, POCT UA - Microscopic Only  THROMBOCYTOPENIA - Plan: POCT CBC and continue to follow up with Oncology  Other cirrhosis of liver - Plan: POCT CBC, CMP14+EGFR  [name] FNP

## 2013-11-18 NOTE — Telephone Encounter (Signed)
appt given for 5 tonight

## 2013-11-19 ENCOUNTER — Ambulatory Visit (INDEPENDENT_AMBULATORY_CARE_PROVIDER_SITE_OTHER): Payer: Medicare Other

## 2013-11-19 ENCOUNTER — Ambulatory Visit (HOSPITAL_COMMUNITY)
Admission: RE | Admit: 2013-11-19 | Discharge: 2013-11-19 | Disposition: A | Discharge status: Home / Self Care | Payer: Medicare Other | Source: Ambulatory Visit | Attending: Family Medicine | Admitting: Family Medicine

## 2013-11-19 ENCOUNTER — Telehealth: Payer: Self-pay | Admitting: Family Medicine

## 2013-11-19 ENCOUNTER — Other Ambulatory Visit: Payer: Medicare Other

## 2013-11-19 DIAGNOSIS — R161 Splenomegaly, not elsewhere classified: Secondary | ICD-10-CM | POA: Diagnosis not present

## 2013-11-19 DIAGNOSIS — R188 Other ascites: Secondary | ICD-10-CM | POA: Diagnosis not present

## 2013-11-19 DIAGNOSIS — G8929 Other chronic pain: Secondary | ICD-10-CM

## 2013-11-19 DIAGNOSIS — R319 Hematuria, unspecified: Secondary | ICD-10-CM | POA: Insufficient documentation

## 2013-11-19 DIAGNOSIS — N400 Enlarged prostate without lower urinary tract symptoms: Secondary | ICD-10-CM | POA: Insufficient documentation

## 2013-11-19 DIAGNOSIS — R1011 Right upper quadrant pain: Secondary | ICD-10-CM

## 2013-11-19 LAB — CMP14+EGFR
ALT: 14 IU/L (ref 0–44)
AST: 30 IU/L (ref 0–40)
Albumin/Globulin Ratio: 1 — ABNORMAL LOW (ref 1.1–2.5)
Albumin: 3 g/dL — ABNORMAL LOW (ref 3.5–4.8)
Alkaline Phosphatase: 204 IU/L — ABNORMAL HIGH (ref 39–117)
BUN/Creatinine Ratio: 9 — ABNORMAL LOW (ref 10–22)
BUN: 20 mg/dL (ref 8–27)
CO2: 23 mmol/L (ref 18–29)
Calcium: 8.3 mg/dL — ABNORMAL LOW (ref 8.6–10.2)
Chloride: 102 mmol/L (ref 97–108)
Creatinine, Ser: 2.14 mg/dL — ABNORMAL HIGH (ref 0.76–1.27)
GFR calc Af Amer: 33 mL/min/{1.73_m2} — ABNORMAL LOW (ref >59)
GFR calc non Af Amer: 28 mL/min/{1.73_m2} — ABNORMAL LOW (ref >59)
Globulin, Total: 3.1 g/dL (ref 1.5–4.5)
Glucose: 141 mg/dL — ABNORMAL HIGH (ref 65–99)
Potassium: 3.8 mmol/L (ref 3.5–5.2)
Sodium: 139 mmol/L (ref 134–144)
Total Bilirubin: 1.5 mg/dL — ABNORMAL HIGH (ref 0.0–1.2)
Total Protein: 6.1 g/dL (ref 6.0–8.5)

## 2013-11-19 LAB — CBC WITH DIFFERENTIAL
Basophils Absolute: 0 10*3/uL (ref 0.0–0.2)
Basos: 0 %
Eos: 3 %
Eosinophils Absolute: 0.1 10*3/uL (ref 0.0–0.4)
HCT: 28.4 % — ABNORMAL LOW (ref 37.5–51.0)
Hemoglobin: 10.3 g/dL — ABNORMAL LOW (ref 12.6–17.7)
Immature Grans (Abs): 0 10*3/uL (ref 0.0–0.1)
Immature Granulocytes: 0 %
Lymphocytes Absolute: 0.4 10*3/uL — ABNORMAL LOW (ref 0.7–3.1)
Lymphs: 16 %
MCH: 33.4 pg — ABNORMAL HIGH (ref 26.6–33.0)
MCHC: 36.3 g/dL — ABNORMAL HIGH (ref 31.5–35.7)
MCV: 92 fL (ref 79–97)
Monocytes Absolute: 0.2 10*3/uL (ref 0.1–0.9)
Monocytes: 6 %
Neutrophils Absolute: 2.1 10*3/uL (ref 1.4–7.0)
Neutrophils Relative %: 75 %
Platelets: 51 10*3/uL — CL (ref 150–379)
RBC: 3.08 x10E6/uL — ABNORMAL LOW (ref 4.14–5.80)
RDW: 14.4 % (ref 12.3–15.4)
WBC: 2.8 10*3/uL — ABNORMAL LOW (ref 3.4–10.8)

## 2013-11-19 LAB — TSH: TSH: 5.11 u[IU]/mL — ABNORMAL HIGH (ref 0.450–4.500)

## 2013-11-19 LAB — VITAMIN B12: Vitamin B-12: 637 pg/mL (ref 211–946)

## 2013-11-19 NOTE — Addendum Note (Signed)
Addended by: Marin Olp on: 11/19/2013 10:06 AM   Modules accepted: Orders

## 2013-11-19 NOTE — Addendum Note (Signed)
Addended by: Marin Olp on: 11/19/2013 11:45 AM   Modules accepted: Orders

## 2013-11-20 NOTE — Telephone Encounter (Signed)
Patient aware.

## 2013-11-21 ENCOUNTER — Other Ambulatory Visit: Payer: Medicare Other

## 2013-11-21 NOTE — Progress Notes (Signed)
Lab only ordered on aug. 31 for a c-diff

## 2013-11-22 ENCOUNTER — Other Ambulatory Visit: Payer: Self-pay | Admitting: Family Medicine

## 2013-11-22 ENCOUNTER — Ambulatory Visit (INDEPENDENT_AMBULATORY_CARE_PROVIDER_SITE_OTHER): Payer: Medicare Other | Admitting: Urology

## 2013-11-22 DIAGNOSIS — N401 Enlarged prostate with lower urinary tract symptoms: Secondary | ICD-10-CM

## 2013-11-22 DIAGNOSIS — R3129 Other microscopic hematuria: Secondary | ICD-10-CM

## 2013-11-22 LAB — CLOSTRIDIUM DIFFICILE BY PCR: Toxigenic C. Difficile by PCR: NEGATIVE

## 2013-12-16 ENCOUNTER — Other Ambulatory Visit: Payer: Self-pay | Admitting: Family Medicine

## 2013-12-27 ENCOUNTER — Ambulatory Visit (INDEPENDENT_AMBULATORY_CARE_PROVIDER_SITE_OTHER): Payer: Medicare Other | Admitting: Urology

## 2013-12-27 DIAGNOSIS — R312 Other microscopic hematuria: Secondary | ICD-10-CM

## 2013-12-27 DIAGNOSIS — N401 Enlarged prostate with lower urinary tract symptoms: Secondary | ICD-10-CM

## 2013-12-30 ENCOUNTER — Other Ambulatory Visit: Payer: Medicare Other

## 2013-12-30 ENCOUNTER — Other Ambulatory Visit: Payer: Self-pay | Admitting: Family Medicine

## 2013-12-30 DIAGNOSIS — R319 Hematuria, unspecified: Principal | ICD-10-CM

## 2013-12-30 DIAGNOSIS — R790 Abnormal level of blood mineral: Secondary | ICD-10-CM

## 2013-12-30 DIAGNOSIS — N390 Urinary tract infection, site not specified: Secondary | ICD-10-CM

## 2013-12-31 LAB — TSH: TSH: 6.53 u[IU]/mL — ABNORMAL HIGH (ref 0.450–4.500)

## 2013-12-31 LAB — BMP8+EGFR
BUN/Creatinine Ratio: 10 (ref 10–22)
BUN: 21 mg/dL (ref 8–27)
CO2: 21 mmol/L (ref 18–29)
Calcium: 8.7 mg/dL (ref 8.6–10.2)
Chloride: 105 mmol/L (ref 97–108)
Creatinine, Ser: 2.19 mg/dL — ABNORMAL HIGH (ref 0.76–1.27)
GFR calc Af Amer: 32 mL/min/{1.73_m2} — ABNORMAL LOW (ref >59)
GFR calc non Af Amer: 28 mL/min/{1.73_m2} — ABNORMAL LOW (ref >59)
Glucose: 199 mg/dL — ABNORMAL HIGH (ref 65–99)
Potassium: 4.2 mmol/L (ref 3.5–5.2)
Sodium: 141 mmol/L (ref 134–144)

## 2014-01-01 LAB — URINE CULTURE

## 2014-01-02 ENCOUNTER — Other Ambulatory Visit: Payer: Self-pay | Admitting: Family Medicine

## 2014-01-02 DIAGNOSIS — N184 Chronic kidney disease, stage 4 (severe): Secondary | ICD-10-CM

## 2014-01-02 MED ORDER — LEVOTHYROXINE SODIUM 25 MCG PO CAPS: 25.0000 ug | ORAL_CAPSULE | Freq: Every day | ORAL | Status: DC

## 2014-01-06 ENCOUNTER — Telehealth: Payer: Self-pay | Admitting: *Deleted

## 2014-01-06 ENCOUNTER — Telehealth: Payer: Self-pay | Admitting: Family Medicine

## 2014-01-06 NOTE — Telephone Encounter (Signed)
Pt notified of results

## 2014-01-06 NOTE — Telephone Encounter (Signed)
Patient scheduled with our pharmacist for 01-22-2014 and nephrology on 02-25-2014.

## 2014-01-06 NOTE — Telephone Encounter (Signed)
Pt notified of lab results Verbalizes understanding 

## 2014-01-07 ENCOUNTER — Telehealth: Payer: Self-pay | Admitting: Family Medicine

## 2014-01-07 NOTE — Telephone Encounter (Signed)
Patient aware.

## 2014-01-09 ENCOUNTER — Telehealth: Payer: Self-pay | Admitting: Family Medicine

## 2014-01-09 NOTE — Telephone Encounter (Signed)
Pt aware and states understanding of all recent labs He has already stopped metformin and has started the thyroid pill He has an appt with Tammy 11/4 and is waiting to hear from referral to neph.

## 2014-01-16 ENCOUNTER — Telehealth: Payer: Self-pay | Admitting: Family Medicine

## 2014-01-16 NOTE — Telephone Encounter (Signed)
Spoke with pt regarding the need for thyroid medication Verbalizes understanding

## 2014-01-22 ENCOUNTER — Encounter: Payer: Self-pay | Admitting: Pharmacist

## 2014-01-22 ENCOUNTER — Ambulatory Visit (INDEPENDENT_AMBULATORY_CARE_PROVIDER_SITE_OTHER): Payer: Medicare Other | Admitting: Pharmacist

## 2014-01-22 VITALS — BP 130/70 | HR 76 | Ht 67.0 in | Wt 170.0 lb

## 2014-01-22 DIAGNOSIS — E1121 Type 2 diabetes mellitus with diabetic nephropathy: Secondary | ICD-10-CM

## 2014-01-22 MED ORDER — GLIMEPIRIDE 1 MG PO TABS: 1.0000 mg | ORAL_TABLET | Freq: Every day | ORAL | Status: DC

## 2014-01-22 NOTE — Progress Notes (Signed)
Subjective:    Matthew Singh is a 80 y.o. male who presents for an initial evaluation of Type 2 diabetes mellitus and for diabetes education.  Mr. Zumstein serum creatinine was noted to be very elevated 10/2013.  It was rechecked about 2 weeks ago and was still elevated.  Metformin was discontinued at that time.    Current symptoms/problems include none .  The patient was initially diagnosed with Type 2 diabetes mellitus about 5 years ago.  Known diabetic complications: nephropathy and cardiovascular disease Cardiovascular risk factors: advanced age (older than 13 for men, 38 for women), diabetes mellitus, dyslipidemia, hypertension and male gender Current diabetic medications include none. Since Metformin was discontinued  Eye exam current (within one year): unknown Weight trend: stable Prior visit with dietician: no Current diet: in general, an "unhealthy" diet Current exercise: none  Current monitoring regimen: none Home blood sugar records: none patinet is not checking BG at home Any episodes of hypoglycemia? no  Is He on ACE inhibitor or angiotensin II receptor blocker?  No 0 due to elevated serum creatinine      The following portions of the patient's history were reviewed and updated as appropriate: allergies, current medications, past family history, past medical history, past social history, past surgical history and problem list.   Objective:    BP 130/70 mmHg  Pulse 76  Ht 5\' 7"  (1.702 m)  Wt 170 lb (77.111 kg)  BMI 26.62 kg/m2   RBG in office today was 218 Last A1c = 6.1% (11/18/2013)  Lab Review GLUCOSE (mg/dL)  Date Value  12/30/2013 199*  11/18/2013 141*   GLUCOSE, BLD (mg/dL)  Date Value  07/13/2012 153*  10/18/2011 131*  07/19/2011 155*   CO2  Date Value  12/30/2013 21 mmol/L  11/18/2013 23 mmol/L  07/13/2012 26 mEq/L   BUN (mg/dL)  Date Value  12/30/2013 21  11/18/2013 20  07/13/2012 18  10/18/2011 17  07/19/2011 17   CREAT (mg/dL)   Date Value  07/13/2012 1.40*   CREATININE, SER (mg/dL)  Date Value  12/30/2013 2.19*  11/18/2013 2.14*  10/18/2011 1.35   Assessment:    Diabetes Mellitus type II, under inadequate control.    Plan:    1.  Rx changes: start glimepiride 1mg  1 tablet qd with breakfast 2.  Education: Reviewed 'ABCs' of diabetes management (respective goals in parentheses):  A1C (<7), blood pressure (<130/80), and cholesterol (LDL <100). 3.   dietary modifications:  Increase non-starchy vegetables - carrots, green bean, squash, zucchini, tomatoes, onions, peppers, spinach and other green leafy vegetables, cabbage, lettuce, cucumbers, asparagus, okra (not fried), eggplant limit sugar and processed foods (cakes, cookies, ice cream, crackers and chips) Increase fresh fruit but limit serving sizes 1/2 cup or about the size of tennis or baseball limit red meat to no more than 1-2 times per week (serving size about the size of your palm)  Choose whole grains / lean proteins - whole wheat bread, quinoa, whole grain rice (1/2 cup), fish, chicken, [place]   4.  increased exercise / physical activity - start with 10 minutes daily and incease as able.   5.  regular blood sugar monitoring: once daily. Rx called to pharmacy for whichever glucometer patient's insurance will allow. Taught patient s/s of hypoglycemia and how to treat.  He was also given about hypoglycemia and appropriate treatment. 6.  Follow up: 5 weeks    Total time spent with patient - 60 minutes  Cherre Robins, PharmD, CPP, CDE

## 2014-01-22 NOTE — Patient Instructions (Signed)
Diabetes and Standards of Medical Care   Diabetes is complicated. You may find that your diabetes team includes a dietitian, nurse, diabetes educator, eye doctor, and more. To help everyone know what is going on and to help you get the care you deserve, the following schedule of care was developed to help keep you on track. Below are the tests, exams, vaccines, medicines, education, and plans you will need.  Blood Glucose Goals Prior to meals = 80 - 130 Within 2 hours of the start of a meal = less than 180  HbA1c test (goal is less than 7.0% - your last value was %) This test shows how well you have controlled your glucose over the past 2 to 3 months. It is used to see if your diabetes management plan needs to be adjusted.   It is performed at least 2 times a year if you are meeting treatment goals.  It is performed 4 times a year if therapy has changed or if you are not meeting treatment goals.  Blood pressure test  This test is performed at every routine medical visit. The goal is less than 140/90 mmHg for most people, but 130/80 mmHg in some cases. Ask your health care provider about your goal.  Dental exam  Follow up with the dentist regularly.  Eye exam  If you are diagnosed with type 1 diabetes as a child, get an exam upon reaching the age of 10 years or older and have had diabetes for 3 to 5 years. Yearly eye exams are recommended after that initial eye exam.  If you are diagnosed with type 1 diabetes as an adult, get an exam within 5 years of diagnosis and then yearly.  If you are diagnosed with type 2 diabetes, get an exam as soon as possible after the diagnosis and then yearly.  Foot care exam  Visual foot exams are performed at every routine medical visit. The exams check for cuts, injuries, or other problems with the feet.  A comprehensive foot exam should be done yearly. This includes visual inspection as well as assessing foot pulses and testing for loss of  sensation.  Check your feet nightly for cuts, injuries, or other problems with your feet. Tell your health care provider if anything is not healing.  Kidney function test (urine microalbumin)  This test is performed once a year.  Type 1 diabetes: The first test is performed 5 years after diagnosis.  Type 2 diabetes: The first test is performed at the time of diagnosis.  A serum creatinine and estimated glomerular filtration rate (eGFR) test is done once a year to assess the level of chronic kidney disease (CKD), if present.  Lipid profile (cholesterol, HDL, LDL, triglycerides)  Performed every 5 years for most people.  The goal for LDL is less than 100 mg/dL. If you are at high risk, the goal is less than 70 mg/dL.  The goal for HDL is 40 mg/dL to 50 mg/dL for men and 50 mg/dL to 60 mg/dL for women. An HDL cholesterol of 60 mg/dL or higher gives some protection against heart disease.  The goal for triglycerides is less than 150 mg/dL.  Influenza vaccine, pneumococcal vaccine, and hepatitis B vaccine  The influenza vaccine is recommended yearly.  The pneumococcal vaccine is generally given once in a lifetime. However, there are some instances when another vaccination is recommended. Check with your health care provider.  The hepatitis B vaccine is also recommended for adults with diabetes.    Diabetes self-management education  Education is recommended at diagnosis and ongoing as needed.  Treatment plan  Your treatment plan is reviewed at every medical visit.  Document Released: 01/02/2009 Document Revised: 11/07/2012 Document Reviewed: 08/07/2012 [employer] Patient Information 2014 [employer].   

## 2014-01-24 ENCOUNTER — Telehealth: Payer: Self-pay | Admitting: Pharmacist

## 2014-01-27 NOTE — Telephone Encounter (Signed)
Would be glad to send rx for 1 month supply to local pharmacy.  Patient has used [employer] [facility] and the [employer].  Called to find out where to send but no answer - LMOVM.

## 2014-01-27 NOTE — Telephone Encounter (Signed)
BG was 128 today.  He will receive glimepiride either tomorrow or Wednesday - so he states will not need to get rx locally

## 2014-02-04 ENCOUNTER — Telehealth: Payer: Self-pay | Admitting: Family Medicine

## 2014-02-05 ENCOUNTER — Other Ambulatory Visit: Payer: Self-pay

## 2014-02-05 MED ORDER — OMEPRAZOLE MAGNESIUM 20 MG PO TBEC: 20.0000 mg | DELAYED_RELEASE_TABLET | Freq: Every day | ORAL | Status: DC

## 2014-02-06 ENCOUNTER — Telehealth: Payer: Self-pay | Admitting: Pharmacist

## 2014-02-06 NOTE — Telephone Encounter (Signed)
Spoke to pt, before bed last night had a cup of jello and 2 graham crackers approximately 10 pm and then he didn't eat for 12 hours. His BS was 50 this morning at 10, he ate 2 boiled eggs at 9:45 this morning. Checked his BS while I was on the phone with him approximately 3 pm and his BS was 17, he was in the midst of eating a sweet potato with butter on it, he hadn't had anything to eat since breakfast. Pt's next appointment is 12/11 with pharmacist, advised pt he should eat snacks in between meals and shouldn't go 12 hours without eating. Pt voiced understanding, advised pt if we needed to change anything prior to his next appt we would let him know.

## 2014-02-06 NOTE — Telephone Encounter (Signed)
Spoke with patient and reinforced what Matthew Singh had told him about eating regular meals and snacks.  Patient was also instructed to call office is has another low BG.  If it occurs again will needed to decrease glimepiride to 1/2 tablet daily.

## 2014-02-10 ENCOUNTER — Other Ambulatory Visit (HOSPITAL_COMMUNITY): Payer: Self-pay | Admitting: Nephrology

## 2014-02-10 DIAGNOSIS — N183 Chronic kidney disease, stage 3 unspecified: Secondary | ICD-10-CM

## 2014-02-11 ENCOUNTER — Telehealth: Payer: Self-pay | Admitting: Family Medicine

## 2014-02-11 NOTE — Telephone Encounter (Signed)
Patient instructed to bring meter and strips over to our lab to check and compare with our machine.

## 2014-02-14 ENCOUNTER — Other Ambulatory Visit: Payer: Self-pay | Admitting: Family Medicine

## 2014-02-20 ENCOUNTER — Ambulatory Visit (HOSPITAL_COMMUNITY)
Admission: RE | Admit: 2014-02-20 | Discharge: 2014-02-20 | Disposition: A | Discharge status: Home / Self Care | Payer: Medicare Other | Source: Ambulatory Visit | Attending: Nephrology | Admitting: Nephrology

## 2014-02-20 ENCOUNTER — Ambulatory Visit (HOSPITAL_COMMUNITY): Payer: Medicare Other

## 2014-02-20 DIAGNOSIS — I350 Nonrheumatic aortic (valve) stenosis: Secondary | ICD-10-CM | POA: Diagnosis not present

## 2014-02-20 DIAGNOSIS — N400 Enlarged prostate without lower urinary tract symptoms: Secondary | ICD-10-CM | POA: Insufficient documentation

## 2014-02-20 DIAGNOSIS — N183 Chronic kidney disease, stage 3 unspecified: Secondary | ICD-10-CM

## 2014-02-20 DIAGNOSIS — I129 Hypertensive chronic kidney disease with stage 1 through stage 4 chronic kidney disease, or unspecified chronic kidney disease: Secondary | ICD-10-CM | POA: Diagnosis not present

## 2014-02-20 DIAGNOSIS — R161 Splenomegaly, not elsewhere classified: Secondary | ICD-10-CM | POA: Diagnosis not present

## 2014-02-20 DIAGNOSIS — E785 Hyperlipidemia, unspecified: Secondary | ICD-10-CM | POA: Diagnosis not present

## 2014-02-20 DIAGNOSIS — E119 Type 2 diabetes mellitus without complications: Secondary | ICD-10-CM | POA: Insufficient documentation

## 2014-02-21 ENCOUNTER — Telehealth: Payer: Self-pay | Admitting: Family Medicine

## 2014-02-21 NOTE — Telephone Encounter (Signed)
Please refill bromocriptine as soon as possible for 90 and schedule patient with the clinical pharmacist to discuss other treatment options because of the expense of medicine

## 2014-02-24 NOTE — Telephone Encounter (Signed)
Does your insurance run out tonight? What pharmacy do you use.? Can we do a refill today?

## 2014-02-24 NOTE — Telephone Encounter (Signed)
Patient is aware he needs to talk to the specialist that prescribed the medication to discuss changes that would help him get a less expensive script.

## 2014-02-28 ENCOUNTER — Ambulatory Visit (INDEPENDENT_AMBULATORY_CARE_PROVIDER_SITE_OTHER): Payer: Medicare Other

## 2014-02-28 ENCOUNTER — Ambulatory Visit (INDEPENDENT_AMBULATORY_CARE_PROVIDER_SITE_OTHER): Payer: Medicare Other | Admitting: Family Medicine

## 2014-02-28 ENCOUNTER — Encounter: Payer: Self-pay | Admitting: Pharmacist

## 2014-02-28 VITALS — BP 124/60 | HR 64 | Ht 67.0 in | Wt 170.0 lb

## 2014-02-28 DIAGNOSIS — R7989 Other specified abnormal findings of blood chemistry: Secondary | ICD-10-CM

## 2014-02-28 DIAGNOSIS — Z23 Encounter for immunization: Secondary | ICD-10-CM

## 2014-02-28 DIAGNOSIS — K7460 Unspecified cirrhosis of liver: Secondary | ICD-10-CM

## 2014-02-28 DIAGNOSIS — R188 Other ascites: Secondary | ICD-10-CM

## 2014-02-28 DIAGNOSIS — R748 Abnormal levels of other serum enzymes: Secondary | ICD-10-CM

## 2014-02-28 DIAGNOSIS — R0602 Shortness of breath: Secondary | ICD-10-CM

## 2014-02-28 DIAGNOSIS — E038 Other specified hypothyroidism: Secondary | ICD-10-CM

## 2014-02-28 DIAGNOSIS — E034 Atrophy of thyroid (acquired): Secondary | ICD-10-CM

## 2014-02-28 DIAGNOSIS — E1121 Type 2 diabetes mellitus with diabetic nephropathy: Secondary | ICD-10-CM

## 2014-02-28 LAB — POCT UA - MICROALBUMIN: Microalbumin Ur, POC: 50 mg/L

## 2014-02-28 LAB — POCT GLYCOSYLATED HEMOGLOBIN (HGB A1C): Hemoglobin A1C: 5.5

## 2014-02-28 MED ORDER — FUROSEMIDE 20 MG PO TABS
20.0000 mg | ORAL_TABLET | Freq: Every day | ORAL | Status: DC
Start: 2014-02-28 — End: 2014-05-06

## 2014-02-28 NOTE — Progress Notes (Signed)
Patient ID: Matthew Singh, male   DOB: 05-11-1932, 80 y.o.   MRN: [id number]  CC:  SOB / edema  HPI:  Patient was initially to be seen for Annual Medicare Wellness Visit.  However, when walking with patient to lab he became extremely short of breath.   Patient was triaged to [facility] for work up.

## 2014-02-28 NOTE — Patient Instructions (Addendum)
Decrease bromocriptine to 2.5mg  1 tablet with breakfast for 1 week, then discontinue.  Continue to monitor blood glucose. If you get any readings over 180 call office for adjustment in glimepiride. Please call [phone]) or send a message through My Chart.    May take an extra furosemide 20mg  tablet after lunch for next 3 days for shortness breath and fluid retention.  If symptoms get worse or if he has not improved by Monday- December 14th return to office for evaluation.  Go to emergency room if becomes more fatigued or changes in mental status.

## 2014-02-28 NOTE — Progress Notes (Signed)
   Subjective:    Patient ID: Matthew Singh, male    DOB: Jan 14, 1933, 80 y.o.   MRN: [id number]  HPI  Patient c/o sob, lethargy, and fatigue.  He was being seen by clinical pharmacist who noted he was sob and gasping.   He states he has been having more swelling in his abdomen recently.  He is being followed by GI in [place] for NASH.   He has hx of CKD and CAD.  He is seen by nephrology and has hx of anemia.    Review of Systems C/o SOB and fatigue   No chest pain, HA, dizziness, vision change, N/V, diarrhea, constipation, dysuria, urinary urgency or frequency, myalgias, arthralgias or rash.  Objective:   Physical Exam Vital signs noted  Chronically ill appearing male   HEENT - Head atraumatic Normocephalic                Eyes - PERRLA, Conjuctiva - clear Sclera- Clear EOMI                Ears - EAC's Wnl TM's Wnl Gross Hearing WNL                Nose - Nares patent                 Throat - oropharanx wnl Respiratory - Lungs CTA bilateral  Oxygen saturation 98% on room air. Cardiac - RRR S1 and S2 without murmur GI - Abdomen soft proturberant and distended with tympany Nontender and bowel sounds active x 4 Extremities - No edema.  No asterixis seen  Results for orders placed or performed in visit on 02/28/14  POCT UA - Microalbumin  Result Value Ref Range   Microalbumin Ur, POC 50 mg/L  POCT glycosylated hemoglobin (Hb A1C)  Result Value Ref Range   Hemoglobin A1C 5.5%    cxr - cardiomegaly Prelimnary reading by Gwyndolyn Saxon Cristan Scherzer,FNP    Assessment & Plan:  Type 2 diabetes mellitus with diabetic nephropathy - Plan: POCT UA - Microalbumin, CMP14+EGFR, POCT glycosylated hemoglobin (Hb A1C), Microalbumin, urine  Hypothyroidism due to acquired atrophy of thyroid - Plan: Thyroid Panel With TSH  Elevated serum creatinine - Plan: CMP14+EGFR  Need for prophylactic vaccination against Streptococcus pneumoniae (pneumococcus) - Plan: CANCELED: Pneumococcal conjugate vaccine  13-valent  SOB (shortness of breath) - Plan: DG Chest 2 View, EKG 12-Lead, Brain natriuretic peptide, AMMONIA, CBC With differential/Platelet, Anemia panel, CANCELED: POCT CBC  Cirrhosis of liver with ascites, unspecified hepatic cirrhosis type - Plan: AMMONIA, CBC With differential/Platelet  Recommend he follow up with GI ASAP and will see if we can get appointment for early next week since it looks like he will need parencentesis.  He is advised as well as his wife that he should go to the ED if worsens.  [name] FNP

## 2014-03-01 LAB — THYROID PANEL WITH TSH
FREE THYROXINE INDEX: 2.4 (ref 1.2–4.9)
T3 Uptake Ratio: 33 % (ref 24–39)
T4, Total: 7.2 ug/dL (ref 4.5–12.0)
TSH: 4.45 u[IU]/mL (ref 0.450–4.500)

## 2014-03-01 LAB — CBC WITH DIFFERENTIAL
Basophils Absolute: 0 10*3/uL (ref 0.0–0.2)
Basos: 1 %
EOS ABS: 0.1 10*3/uL (ref 0.0–0.4)
Eos: 5 %
HCT: 27.2 % — ABNORMAL LOW (ref 37.5–51.0)
Hemoglobin: 9.5 g/dL — ABNORMAL LOW (ref 12.6–17.7)
Immature Grans (Abs): 0 10*3/uL (ref 0.0–0.1)
Immature Granulocytes: 0 %
LYMPHS ABS: 0.4 10*3/uL — AB (ref 0.7–3.1)
Lymphs: 19 %
MCH: 33.5 pg — AB (ref 26.6–33.0)
MCHC: 34.9 g/dL (ref 31.5–35.7)
MCV: 96 fL (ref 79–97)
MONOS ABS: 0.2 10*3/uL (ref 0.1–0.9)
Monocytes: 11 %
NEUTROS ABS: 1.4 10*3/uL (ref 1.4–7.0)
Neutrophils Relative %: 64 %
Platelets: 36 10*3/uL — CL (ref 150–379)
RBC: 2.84 x10E6/uL — ABNORMAL LOW (ref 4.14–5.80)
RDW: 16.3 % — AB (ref 12.3–15.4)
WBC: 2.2 10*3/uL — CL (ref 3.4–10.8)

## 2014-03-01 LAB — MICROALBUMIN, URINE: Microalbumin, Urine: 683.7 ug/mL — ABNORMAL HIGH (ref 0.0–17.0)

## 2014-03-01 LAB — CMP14+EGFR
A/G RATIO: 1 — AB (ref 1.1–2.5)
ALT: 18 IU/L (ref 0–44)
AST: 35 IU/L (ref 0–40)
Albumin: 3.1 g/dL — ABNORMAL LOW (ref 3.5–4.8)
Alkaline Phosphatase: 251 IU/L — ABNORMAL HIGH (ref 39–117)
BUN/Creatinine Ratio: 14 (ref 10–22)
BUN: 30 mg/dL — ABNORMAL HIGH (ref 8–27)
CO2: 20 mmol/L (ref 18–29)
CREATININE: 2.15 mg/dL — AB (ref 0.76–1.27)
Calcium: 8.4 mg/dL — ABNORMAL LOW (ref 8.6–10.2)
Chloride: 109 mmol/L — ABNORMAL HIGH (ref 97–108)
GFR calc Af Amer: 33 mL/min/{1.73_m2} — ABNORMAL LOW (ref >59)
GFR, EST NON AFRICAN AMERICAN: 28 mL/min/{1.73_m2} — AB (ref >59)
GLUCOSE: 115 mg/dL — AB (ref 65–99)
Globulin, Total: 3.2 g/dL (ref 1.5–4.5)
POTASSIUM: 4 mmol/L (ref 3.5–5.2)
Sodium: 142 mmol/L (ref 134–144)
Total Bilirubin: 1.3 mg/dL — ABNORMAL HIGH (ref 0.0–1.2)
Total Protein: 6.3 g/dL (ref 6.0–8.5)

## 2014-03-03 ENCOUNTER — Other Ambulatory Visit: Payer: Self-pay | Admitting: Family Medicine

## 2014-03-03 ENCOUNTER — Telehealth: Payer: Self-pay | Admitting: Family Medicine

## 2014-03-03 NOTE — Telephone Encounter (Signed)
Patient aware of results and need to follow up with GI for abdominal swelling.

## 2014-03-03 NOTE — Telephone Encounter (Signed)
-----   Message from [name], FNP sent at 03/03/2014  9:24 AM EST ----- Follow up if SOB is worse.  He has small left pleural effusion and would follow up cxr if sob worsens.  Please follow up with GI for abdominal swelling

## 2014-03-04 ENCOUNTER — Ambulatory Visit (INDEPENDENT_AMBULATORY_CARE_PROVIDER_SITE_OTHER): Payer: Medicare Other | Admitting: Family Medicine

## 2014-03-04 ENCOUNTER — Other Ambulatory Visit: Payer: Self-pay | Admitting: Family Medicine

## 2014-03-04 ENCOUNTER — Encounter: Payer: Self-pay | Admitting: Family Medicine

## 2014-03-04 VITALS — BP 159/64 | HR 60 | Temp 97.5°F | Ht 67.0 in | Wt 166.4 lb

## 2014-03-04 DIAGNOSIS — R188 Other ascites: Principal | ICD-10-CM

## 2014-03-04 DIAGNOSIS — K7460 Unspecified cirrhosis of liver: Secondary | ICD-10-CM

## 2014-03-04 LAB — AMMONIA: Ammonia: 264 ug/dL (ref 27–102)

## 2014-03-04 LAB — SPECIMEN STATUS REPORT

## 2014-03-04 LAB — BRAIN NATRIURETIC PEPTIDE: BNP: 372.9 pg/mL — AB (ref 0.0–100.0)

## 2014-03-04 MED ORDER — LACTULOSE 10 GM/15ML PO SOLN: 20.0000 g | Freq: Two times a day (BID) | ORAL | Status: DC

## 2014-03-04 NOTE — Progress Notes (Signed)
   Subjective:    Patient ID: Matthew Singh, male    DOB: 04-25-1932, 80 y.o.   MRN: [id number]  HPI He is follow up on his SOB and abdominal distention from last week.  He was scheduled an appointment with GI in [facility] for next week.  He had his lasix doubled for 3 days and reports that he is breathing better.  His platelets have dropped to 34.  His amonia resulted today and was 264.  He was called and he denied any problems with confusion but is c/o fatigue and being tired.    Review of Systems  Constitutional: Negative for fever.  HENT: Negative for ear pain.   Eyes: Negative for discharge.  Respiratory: Negative for cough.   Cardiovascular: Negative for chest pain.  Gastrointestinal: Negative for abdominal distention.  Endocrine: Negative for polyuria.  Genitourinary: Negative for difficulty urinating.  Musculoskeletal: Negative for gait problem and neck pain.  Skin: Negative for color change and rash.  Neurological: Negative for speech difficulty and headaches.  Psychiatric/Behavioral: Negative for agitation.       Objective:    BP 159/64 mmHg  Pulse 60  Temp(Src) 97.5 F (36.4 C) (Oral)  Ht 5\' 7"  (1.702 m)  Wt 166 lb 6.4 oz (75.479 kg)  BMI 26.06 kg/m2   Physical Exam  Constitutional: He is oriented to person, place, and time.  Chronically ill appearing male in NAD  HENT:  Head: Normocephalic and atraumatic.  Mouth/Throat: Oropharynx is clear and moist.  Eyes: Pupils are equal, round, and reactive to light.  Neck: Normal range of motion. Neck supple.  Cardiovascular: Normal rate and regular rhythm.   No murmur heard. Pulmonary/Chest: Effort normal and breath sounds normal.  Abdominal: Soft. Bowel sounds are normal. He exhibits distension. There is no tenderness.  Neurological: He is alert and oriented to person, place, and time.  Skin: Skin is warm and dry.  Psychiatric: He has a normal mood and affect.          Assessment & Plan:     ICD-9-CM  ICD-10-CM   1. Cirrhosis of liver with ascites, unspecified hepatic cirrhosis type 571.5 K74.60 lactulose (CHRONULAC) 10 GM/15ML solution  Lactulose bid  Follow up with GI next week and have given copy of lab to give GI with amonia results.  Recommend going to the ED if any confusion. Return if symptoms worsen or fail to improve.  [name] FNP

## 2014-03-05 ENCOUNTER — Telehealth: Payer: Self-pay | Admitting: Family Medicine

## 2014-03-06 ENCOUNTER — Telehealth: Payer: Self-pay

## 2014-03-06 NOTE — Telephone Encounter (Signed)
I thought you were referring me to GI Dr.  In [facility] Dr Amalia Hailey

## 2014-03-06 NOTE — Telephone Encounter (Signed)
LM,    Provider off today.  Please call back and leave more details so we can help him.

## 2014-03-07 ENCOUNTER — Telehealth: Payer: Self-pay | Admitting: Family Medicine

## 2014-03-07 NOTE — Telephone Encounter (Signed)
Pt aare of appointment date/time with Dr. Halford Chessman  03/11/2014 at 3:45  [address]

## 2014-03-07 NOTE — Telephone Encounter (Signed)
This is caused by the chronulac syrup and the diarrhea is necessary to reduce serum amonia levels.   Need to follow up with GI at appointment scheduled next week and see if they want this changed. Continue this so the amonia level can be reduced.

## 2014-03-07 NOTE — Telephone Encounter (Signed)
Patient is having severe diarrhea since starting the lactulose please advise

## 2014-03-07 NOTE — Telephone Encounter (Signed)
Patient aware and verbalizes understanding. 

## 2014-03-07 NOTE — Telephone Encounter (Signed)
[facility] [phone]

## 2014-03-07 NOTE — Telephone Encounter (Signed)
Ah yes and we discussed this in the visit.  He has an appointment next week with GI and this was given to him at the appointment.  Tammy Eckard and Zigmund Daniel know what time and day of his appointment so ask them or call his GI office.   If he is having worsening confusion then he needs to go to ED.

## 2014-04-14 ENCOUNTER — Ambulatory Visit: Payer: Self-pay

## 2014-04-14 ENCOUNTER — Telehealth: Payer: Self-pay | Admitting: Family Medicine

## 2014-04-14 NOTE — Telephone Encounter (Signed)
Patient went to [facility] with back pain yesterday and was told he after a CT Scan that he had a pulled muscle.  He was given oxycodone 5mg  #10 tablets.  His wife calls today to see if I could prescribe something stronger. I told her that I could not prescribed controlled substance or pain medications.  I will send this request to triage but a provider would most likely want to see him and his wife states that he cannot get in the office to be seen.

## 2014-04-15 ENCOUNTER — Telehealth: Payer: Self-pay | Admitting: Family Medicine

## 2014-04-15 NOTE — Telephone Encounter (Signed)
NTBS.

## 2014-04-15 NOTE — Telephone Encounter (Signed)
Patient states the oxycodone the hospital gave him is not helping and wants to know if we can give him a muscle relaxer. Please advise

## 2014-04-15 NOTE — Telephone Encounter (Signed)
Pt aware ntbs, has appt already scheduled 2/4, will just keep that appt.

## 2014-04-18 ENCOUNTER — Telehealth: Payer: Self-pay | Admitting: Family Medicine

## 2014-04-18 NOTE — Telephone Encounter (Signed)
Left detailed msg stating [facility] would ntbs and evaluated to get a RX for pain/muscle relaxer, I had told them this earlier this week.

## 2014-04-24 ENCOUNTER — Encounter: Payer: Self-pay | Admitting: Family Medicine

## 2014-04-24 ENCOUNTER — Ambulatory Visit (INDEPENDENT_AMBULATORY_CARE_PROVIDER_SITE_OTHER): Payer: Medicare Other | Admitting: Family Medicine

## 2014-04-24 VITALS — BP 138/69 | HR 72

## 2014-04-24 DIAGNOSIS — N184 Chronic kidney disease, stage 4 (severe): Secondary | ICD-10-CM

## 2014-04-24 DIAGNOSIS — M5441 Lumbago with sciatica, right side: Secondary | ICD-10-CM | POA: Diagnosis not present

## 2014-04-24 MED ORDER — OXYCODONE-ACETAMINOPHEN 10-325 MG PO TABS: 1.0000 | ORAL_TABLET | ORAL | Status: AC | PRN

## 2014-04-24 NOTE — Progress Notes (Signed)
   Subjective:    Patient ID: Matthew Singh, male    DOB: [birth year], 81 y.o.   MRN: [id number]  HPI Patient is here c/o severe abdominal and back pain.  He has been taking percocet 62m and the pain is not being controlled.  [facility] and last was 3 weeks ago.  He has cirrhosis and hepatic insufficiency.  His amonia level was in the 200's a few weeks ago when he was sent back to see his hepatologist.  He sees Nephrology for CKD and his son states he is due for repeat labs because his last creatinine was 3.2.  He is having SOB abdominal swelling, abdominal pain, and back pain.  He is requesting pain medication.  His son has called GI in [place] and he is going to be seen there later today.  Review of Systems  Constitutional: Positive for fatigue. Negative for fever.  HENT: Negative for ear pain.   Eyes: Negative for discharge.  Respiratory: Positive for shortness of breath. Negative for cough.   Cardiovascular: Negative for chest pain.  Gastrointestinal: Positive for abdominal pain and abdominal distention.  Endocrine: Negative for polyuria.  Genitourinary: Negative for difficulty urinating.  Musculoskeletal: Positive for back pain. Negative for gait problem and neck pain.  Skin: Negative for color change and rash.  Neurological: Negative for speech difficulty and headaches.  Psychiatric/Behavioral: Negative for agitation.       Objective:    BP 138/69 mmHg  Pulse 72  SpO2 99% Physical Exam  Constitutional: He is oriented to person, place, and time.  Chronically ill appearing male in pain.  HENT:  Head: Normocephalic and atraumatic.  Mouth/Throat: Oropharynx is clear and moist.  Eyes: Pupils are equal, round, and reactive to light.  Neck: Normal range of motion. Neck supple.  Cardiovascular: Normal rate and regular rhythm.   No murmur heard. Pulmonary/Chest: Effort normal and breath sounds normal.  Abdominal: Soft. Bowel  sounds are normal. He exhibits distension. There is tenderness.  Abdominal Ascites  Neurological: He is alert and oriented to person, place, and time.  Skin: Skin is warm and dry.  Psychiatric: He has a normal mood and affect.          Assessment & Plan:     ICD-9-CM ICD-10-CM   1. Low back pain with right-sided sciatica, unspecified back pain laterality 724.3 M54.41 oxyCODONE-acetaminophen (PERCOCET) 10-325 MG per tablet  2. Chronic kidney disease (CKD), stage IV (severe) 585.4 N18.4 BMP8+EGFR   I called GI doctor office and unable to get a nurse or doctor on the phone and left message I was going to rx him pain medication.  No Follow-up on file.  WLysbeth PennerFNP

## 2014-04-25 ENCOUNTER — Other Ambulatory Visit: Payer: Self-pay | Admitting: *Deleted

## 2014-04-25 DIAGNOSIS — N189 Chronic kidney disease, unspecified: Secondary | ICD-10-CM

## 2014-04-25 LAB — BMP8+EGFR
BUN/Creatinine Ratio: 13 (ref 10–22)
BUN: 31 mg/dL — ABNORMAL HIGH (ref 8–27)
CO2: 22 mmol/L (ref 18–29)
Calcium: 8.5 mg/dL — ABNORMAL LOW (ref 8.6–10.2)
Chloride: 105 mmol/L (ref 97–108)
Creatinine, Ser: 2.35 mg/dL — ABNORMAL HIGH (ref 0.76–1.27)
GFR calc Af Amer: 29 mL/min/{1.73_m2} — ABNORMAL LOW (ref >59)
GFR calc non Af Amer: 25 mL/min/{1.73_m2} — ABNORMAL LOW (ref >59)
Glucose: 154 mg/dL — ABNORMAL HIGH (ref 65–99)
Potassium: 4 mmol/L (ref 3.5–5.2)
Sodium: 142 mmol/L (ref 134–144)

## 2014-04-30 ENCOUNTER — Telehealth: Payer: Self-pay | Admitting: Family Medicine

## 2014-04-30 ENCOUNTER — Other Ambulatory Visit: Payer: Self-pay | Admitting: Family Medicine

## 2014-04-30 DIAGNOSIS — E785 Hyperlipidemia, unspecified: Secondary | ICD-10-CM

## 2014-04-30 DIAGNOSIS — R188 Other ascites: Principal | ICD-10-CM

## 2014-04-30 DIAGNOSIS — K7460 Unspecified cirrhosis of liver: Secondary | ICD-10-CM

## 2014-05-01 ENCOUNTER — Other Ambulatory Visit: Payer: Self-pay | Admitting: Family Medicine

## 2014-05-01 MED ORDER — TAMSULOSIN HCL 0.4 MG PO CAPS: 0.4000 mg | ORAL_CAPSULE | Freq: Every day | ORAL | Status: AC

## 2014-05-01 MED ORDER — GLIMEPIRIDE 1 MG PO TABS: 1.0000 mg | ORAL_TABLET | Freq: Every day | ORAL | Status: AC

## 2014-05-01 NOTE — Telephone Encounter (Signed)
Pt needed 1 month supply sent over to [facility] in [place] until he gets his from [facility]. Rx sent over.

## 2014-05-02 ENCOUNTER — Encounter: Payer: Medicare Other | Admitting: Family Medicine

## 2014-05-02 NOTE — Progress Notes (Deleted)
Subjective:  Patient ID: Matthew Singh, male    DOB: Nov 24, 1932  Age: 81 y.o. MRN: [id number]  CC: No chief complaint on file.   HPI Matthew Singh presents for diabetes care Patient does not check blood sugar at home Patient denies symptoms such as polyuria, polydipsia, excessive hunger, nausea No significant hypoglycemic spells noted. Medications as noted below. Taking them regularly without complication/adverse reaction being reported today.   He has nonalcoholic cirrhosis and chronic renal insufficiency. Most recent creatinine was 3.2. Recent ultrasound noted below shows prostate enlargement and bladder deformity.   History Matthew Singh has a past medical history of NASH (nonalcoholic steatohepatitis); DIABETES MELLITUS, TYPE II (11/02/2006); Pure hypercholesterolemia (10/08/2007); HYPERLIPIDEMIA-MIXED (07/02/2009); GOUT (06/14/2007); THROMBOCYTOPENIA (12/17/2008); HYPERTENSION (10/08/2007); CORONARY ARTERY DISEASE (11/02/2006); CORONARY ATHEROSCLEROSIS NATIVE CORONARY ARTERY (11/17/2009); AORTIC STENOSIS (07/02/2009); GERD (11/02/2006); FATTY LIVER DISEASE (10/08/2007); HYPERTROPHY PROSTATE W/UR OBST & OTH LUTS (02/21/2009); and Chronic kidney disease.   He has past surgical history that includes Coronary artery bypass graft; stress cardiolite (08/11/2006); transthoracic echocardiogram (08/11/2006); Aortic valve replacement; and Cholecystectomy.   His family history includes Cancer in his mother and sister; Heart attack (age of onset: 70) in his father; Heart disease in his father.He reports that he quit smoking about 42 years ago. His smoking use included Cigarettes. He smoked 0.25 packs per day. He has never used smokeless tobacco. He reports that he does not drink alcohol or use illicit drugs.  Current Outpatient Prescriptions on File Prior to Visit  Medication Sig Dispense Refill  . allopurinol (ZYLOPRIM) 100 MG tablet TAKE ONE TABLET BY MOUTH DAILY AS NEEDED 30 tablet 1  . amLODipine  (NORVASC) 2.5 MG tablet Take 1 tablet (2.5 mg total) by mouth daily. 30 tablet 10  . bromocriptine (PARLODEL) 2.5 MG tablet Take 1 tablet (2.5 mg total) by mouth 2 (two) times daily. 14 tablet 0  . ferrous sulfate 325 (65 FE) MG tablet TAKE ONE TABLET BY MOUTH TWICE DAILY 60 tablet 2  . folic acid (FOLVITE) 1 MG tablet Take 1 tablet (1 mg total) by mouth daily. 30 tablet 10  . furosemide (LASIX) 20 MG tablet Take 1 tablet (20 mg total) by mouth daily. May take an extra 1 tablet after lunch as needed for fluid retention or shortness of breath 45 tablet 1  . glimepiride (AMARYL) 1 MG tablet Take 1 tablet (1 mg total) by mouth daily with breakfast. 30 tablet 0  . glucose blood (ONE TOUCH ULTRA TEST) test strip Use as instructed. Check bs four times a day 100 each 3  . lactulose (CHRONULAC) 10 GM/15ML solution Take 30 mLs (20 g total) by mouth 2 (two) times daily. 240 mL 3  . Levothyroxine Sodium 25 MCG CAPS Take 1 capsule (25 mcg total) by mouth daily before breakfast. 30 capsule 11  . nitroGLYCERIN (NITROSTAT) 0.4 MG SL tablet Place 0.4 mg under the tongue every 5 (five) minutes as needed. At onset of chest pain for up to 3 doses     . omeprazole (PRILOSEC OTC) 20 MG tablet Take 1 tablet (20 mg total) by mouth daily. 90 tablet 0  . omeprazole (PRILOSEC) 40 MG capsule   2  . ONE TOUCH LANCETS MISC 1 drop by Percutaneous route 4 (four) times daily. Use four times a day 400 each 3  . oxyCODONE-acetaminophen (PERCOCET) 10-325 MG per tablet Take 1 tablet by mouth every 4 (four) hours as needed for pain. 60 tablet 0  . pravastatin (PRAVACHOL) 80 MG tablet 1/2 tablet  at bedtime 45 tablet 0  . tamsulosin (FLOMAX) 0.4 MG CAPS capsule Take 1 capsule (0.4 mg total) by mouth daily. 1 capsule by mouth once daily 30 capsule 0   No current facility-administered medications on file prior to visit.    ROS Review of Systems  Constitutional: Negative for fever, chills, diaphoresis and unexpected weight change.    HENT: Negative for congestion, hearing loss, rhinorrhea, sore throat and trouble swallowing.   Respiratory: Negative for cough, chest tightness, shortness of breath and wheezing.   Gastrointestinal: Negative for nausea, vomiting, abdominal pain, diarrhea, constipation and abdominal distention.  Endocrine: Negative for cold intolerance and heat intolerance.  Genitourinary: Negative for dysuria, hematuria and flank pain.  Musculoskeletal: Negative for joint swelling and arthralgias.  Skin: Negative for rash.  Neurological: Negative for dizziness and headaches.  Psychiatric/Behavioral: Negative for dysphoric mood, decreased concentration and agitation. The patient is not nervous/anxious.     Objective:  There were no vitals taken for this visit.  BP Readings from Last 3 Encounters:  04/24/14 138/69  03/04/14 159/64  02/28/14 124/60    Wt Readings from Last 3 Encounters:  03/04/14 166 lb 6.4 oz (75.479 kg)  02/28/14 170 lb (77.111 kg)  01/22/14 170 lb (77.111 kg)     Physical Exam  Constitutional: He is oriented to person, place, and time. He appears well-developed and well-nourished. No distress.  HENT:  Head: Normocephalic and atraumatic.  Right Ear: External ear normal.  Left Ear: External ear normal.  Nose: Nose normal.  Mouth/Throat: Oropharynx is clear and moist.  Eyes: Conjunctivae and EOM are normal. Pupils are equal, round, and reactive to light.  Neck: Normal range of motion. Neck supple. No thyromegaly present.  Cardiovascular: Normal rate, regular rhythm and normal heart sounds.   No murmur heard. Pulmonary/Chest: Effort normal and breath sounds normal. No respiratory distress. He has no wheezes. He has no rales.  Abdominal: Soft. Bowel sounds are normal. He exhibits no distension. There is no tenderness.  Lymphadenopathy:    He has no cervical adenopathy.  Neurological: He is alert and oriented to person, place, and time. He has normal reflexes.  Skin: Skin is  warm and dry.  Psychiatric: He has a normal mood and affect. His behavior is normal. Judgment and thought content normal.    Lab Results  Component Value Date   HGBA1C 5.5% 02/28/2014   HGBA1C 6.1 11/18/2013   HGBA1C 5.5 07/13/2012    Lab Results  Component Value Date   WBC 2.2* 02/28/2014   HGB 9.5* 02/28/2014   HCT 27.2* 02/28/2014   PLT 36* 02/28/2014   GLUCOSE 154* 04/24/2014   CHOL 115 04/26/2011   TRIG 67 07/13/2012   HDL 38.20* 04/26/2011   LDLCALC 47 07/13/2012   ALT 18 02/28/2014   AST 35 02/28/2014   NA 142 04/24/2014   K 4.0 04/24/2014   CL 105 04/24/2014   CREATININE 2.35* 04/24/2014   BUN 31* 04/24/2014   CO2 22 04/24/2014   TSH 4.450 02/28/2014   PSA 0.55 06/17/2010   HGBA1C 5.5% 02/28/2014   MICROALBUR 5.0* 12/20/2010    US Renal  02/20/2014   CLINICAL DATA:  Chronic renal disease.  EXAM: RENAL/URINARY TRACT ULTRASOUND COMPLETE  COMPARISON:  CT 11/19/2013.  FINDINGS: Right Kidney:  Length: 9.3 cm. Increased echogenicity. No mass or hydronephrosis visualized.  Left Kidney:  Length: 9.9 cm. Increased echogenicity. No mass or hydronephrosis visualized.  Bladder:  Severe prostate enlargement noted with deformity of the bladder base. Prostate  and or bladder malignancy cannot be excluded.  Incidental note is made of splenomegaly.  IMPRESSION: 1. Increased echogenicity both kidneys consistent with chronic medical renal disease. 2. Prostate enlargement with deformity of the bladder base. Prostate and or bladder malignancy cannot be excluded.   Electronically Signed   By: Marcello Moores  Register   On: 02/20/2014 16:37    Assessment & Plan:   There are no diagnoses linked to this encounter. I am having Mr. Sada maintain his nitroGLYCERIN, folic acid, amLODipine, ONE TOUCH LANCETS, glucose blood, bromocriptine, pravastatin, allopurinol, Levothyroxine Sodium, omeprazole, ferrous sulfate, furosemide, omeprazole, lactulose, oxyCODONE-acetaminophen, glimepiride, and  tamsulosin.  No orders of the defined types were placed in this encounter.    Comments: ***  Follow-up: No Follow-up on file.  Claretta Fraise, M.D.

## 2014-05-06 MED ORDER — FOLIC ACID 1 MG PO TABS: 1.0000 mg | ORAL_TABLET | Freq: Every day | ORAL | Status: AC

## 2014-05-06 MED ORDER — ALLOPURINOL 100 MG PO TABS: 100.0000 mg | ORAL_TABLET | Freq: Every day | ORAL | Status: AC | PRN

## 2014-05-06 MED ORDER — AMLODIPINE BESYLATE 2.5 MG PO TABS: 2.5000 mg | ORAL_TABLET | Freq: Every day | ORAL | Status: AC

## 2014-05-06 MED ORDER — OMEPRAZOLE MAGNESIUM 20 MG PO TBEC: 20.0000 mg | DELAYED_RELEASE_TABLET | Freq: Every day | ORAL | Status: AC

## 2014-05-06 MED ORDER — LACTULOSE 10 GM/15ML PO SOLN: 20.0000 g | Freq: Two times a day (BID) | ORAL | Status: AC

## 2014-05-06 MED ORDER — FERROUS SULFATE 325 (65 FE) MG PO TABS: 325.0000 mg | ORAL_TABLET | Freq: Two times a day (BID) | ORAL | Status: AC

## 2014-05-06 MED ORDER — FUROSEMIDE 20 MG PO TABS: 20.0000 mg | ORAL_TABLET | Freq: Every day | ORAL | Status: AC

## 2014-05-06 MED ORDER — PRAVASTATIN SODIUM 80 MG PO TABS: ORAL_TABLET | ORAL | Status: AC

## 2014-05-06 MED ORDER — LEVOTHYROXINE SODIUM 25 MCG PO CAPS: 25.0000 ug | ORAL_CAPSULE | Freq: Every day | ORAL | Status: AC

## 2014-05-06 NOTE — Telephone Encounter (Signed)
done

## 2014-06-09 NOTE — Progress Notes (Signed)
erroneous

## 2014-06-20 DEATH — deceased

## 2014-12-25 IMAGING — CR DG CHEST 2V
2 series · 2 of 2 positions shown · non-contrast
Comparison: 12/20/2010

CLINICAL DATA: Difficulty breathing

EXAM:
CHEST  2 VIEW

[view not recorded (1 of 2)]
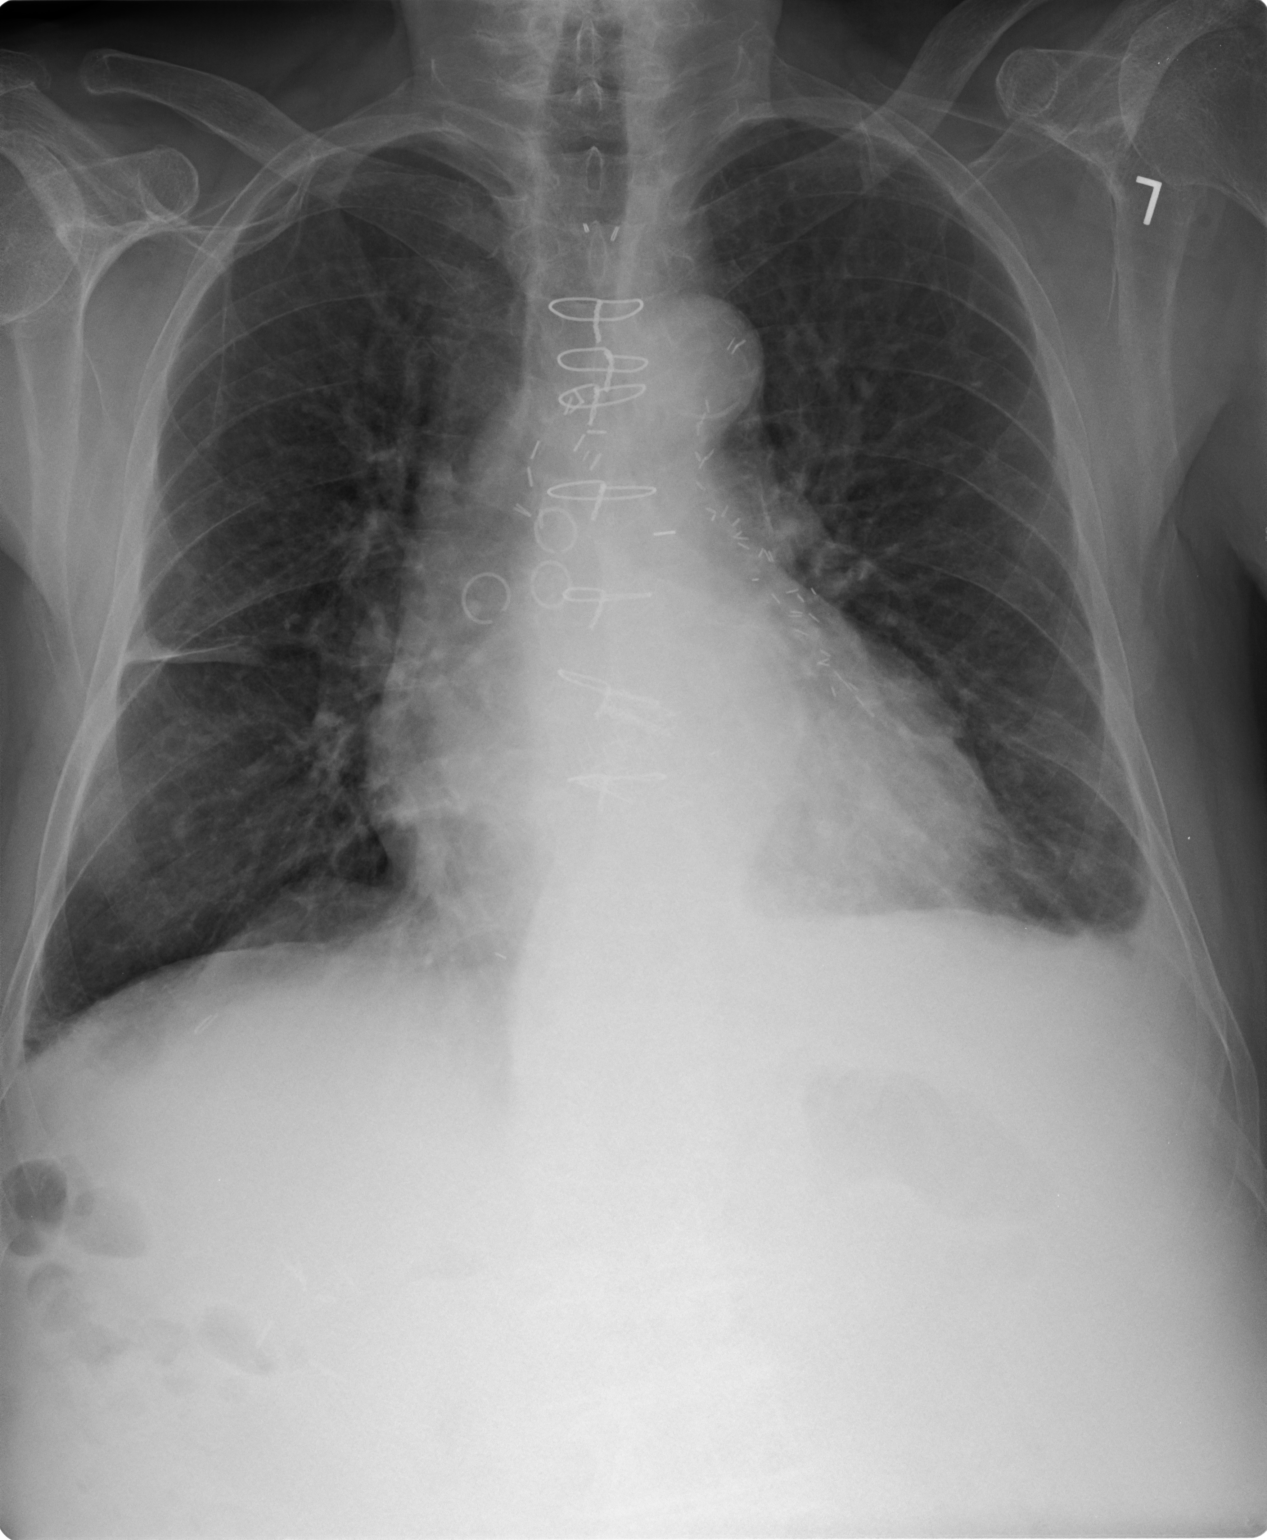

[view not recorded (2 of 2)]
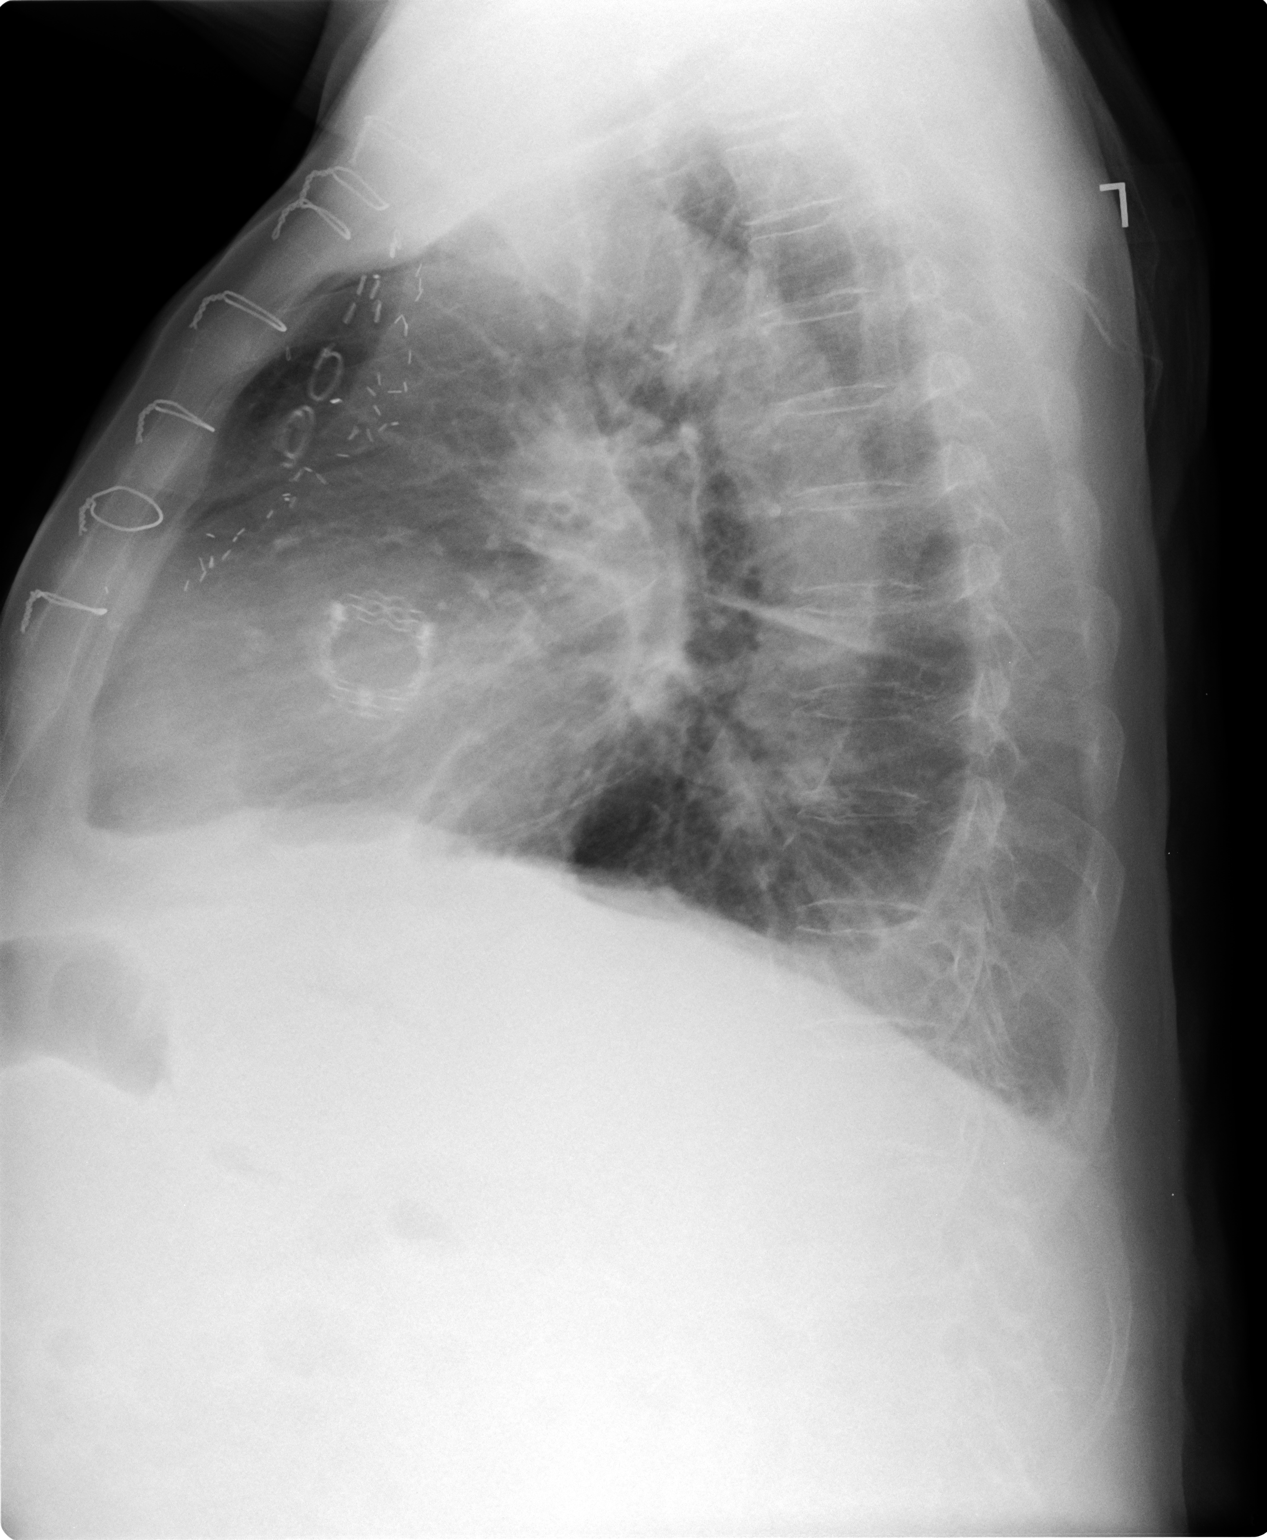

[2 of 2 positions shown; findings below may reference images not displayed]

FINDINGS: Cardiac shadow is prominent. Postsurgical changes are again seen.
The lungs are well aerated bilaterally. Small left-sided pleural
effusion is noted. Mild interstitial changes are again seen and
stable. Aortic calcifications are seen. Thickening of the minor
fissure is noted on the right. No bony abnormality is seen.
IMPRESSION: Small left pleural effusion.

Chronic interstitial changes.
